# Patient Record
Sex: Female | Born: 1950
Health system: Southern US, Community
[De-identification: ages and names within clinical notes are randomized; demographics above are authoritative.]

## PROBLEM LIST (undated history)

## (undated) ENCOUNTER — Inpatient Hospital Stay: Admission: EM | Payer: Self-pay | Source: Home / Self Care

## (undated) DIAGNOSIS — T7840XA Allergy, unspecified, initial encounter: Secondary | ICD-10-CM

## (undated) DIAGNOSIS — D649 Anemia, unspecified: Secondary | ICD-10-CM

## (undated) DIAGNOSIS — E785 Hyperlipidemia, unspecified: Secondary | ICD-10-CM

## (undated) DIAGNOSIS — I10 Essential (primary) hypertension: Secondary | ICD-10-CM

## (undated) DIAGNOSIS — Z86010 Personal history of colonic polyps: Secondary | ICD-10-CM

## (undated) DIAGNOSIS — M810 Age-related osteoporosis without current pathological fracture: Secondary | ICD-10-CM

## (undated) HISTORY — DX: Allergy, unspecified, initial encounter: T78.40XA

## (undated) HISTORY — DX: Essential (primary) hypertension: I10

## (undated) HISTORY — PX: WISDOM TOOTH EXTRACTION: SHX21

## (undated) HISTORY — DX: Hyperlipidemia, unspecified: E78.5

## (undated) HISTORY — DX: Anemia, unspecified: D64.9

## (undated) HISTORY — DX: Personal history of colonic polyps: Z86.010

## (undated) HISTORY — DX: Age-related osteoporosis without current pathological fracture: M81.0

## (undated) HISTORY — PX: POLYPECTOMY: SHX149

---

## 2003-09-20 ENCOUNTER — Other Ambulatory Visit: Admission: RE | Admit: 2003-09-20 | Discharge: 2003-09-20 | Payer: Self-pay | Admitting: Family Medicine

## 2008-11-11 LAB — HM PAP SMEAR: HM Pap smear: NORMAL

## 2008-11-23 LAB — HM DEXA SCAN

## 2011-05-28 HISTORY — PX: COLONOSCOPY: SHX174

## 2011-06-11 LAB — LIPID PANEL
LDL Cholesterol: 40 mg/dL
Triglycerides: 149 mg/dL (ref 40–160)

## 2011-12-18 ENCOUNTER — Encounter: Payer: Self-pay | Admitting: Internal Medicine

## 2012-01-21 ENCOUNTER — Ambulatory Visit (AMBULATORY_SURGERY_CENTER): Payer: BC Managed Care – PPO | Admitting: *Deleted

## 2012-01-21 ENCOUNTER — Encounter: Payer: Self-pay | Admitting: Internal Medicine

## 2012-01-21 VITALS — Ht 64.5 in | Wt 148.4 lb

## 2012-01-21 DIAGNOSIS — Z1211 Encounter for screening for malignant neoplasm of colon: Secondary | ICD-10-CM

## 2012-01-21 MED ORDER — NA SULFATE-K SULFATE-MG SULF 17.5-3.13-1.6 GM/177ML PO SOLN: 1.0000 | Freq: Once | ORAL | Status: DC

## 2012-01-21 NOTE — Progress Notes (Signed)
No allergies to eggs or soy products 

## 2012-02-04 ENCOUNTER — Ambulatory Visit (AMBULATORY_SURGERY_CENTER): Payer: BC Managed Care – PPO | Admitting: Internal Medicine

## 2012-02-04 ENCOUNTER — Encounter: Payer: Self-pay | Admitting: Internal Medicine

## 2012-02-04 VITALS — BP 129/66 | HR 74 | Temp 97.7°F | Resp 14 | Ht 64.0 in | Wt 148.0 lb

## 2012-02-04 DIAGNOSIS — Z1211 Encounter for screening for malignant neoplasm of colon: Secondary | ICD-10-CM

## 2012-02-04 DIAGNOSIS — D126 Benign neoplasm of colon, unspecified: Secondary | ICD-10-CM

## 2012-02-04 MED ORDER — SODIUM CHLORIDE 0.9 % IV SOLN: 500.0000 mL | INTRAVENOUS | Status: DC

## 2012-02-04 NOTE — Op Note (Signed)
[facility] [address] [place], [address]   COLONOSCOPY PROCEDURE REPORT  PATIENT: Lauren Dunn, Lauren Dunn.  MR#: [id number] BIRTHDATE: 20-Jun-1950 , 61  yrs. old GENDER: Female ENDOSCOPIST: Iva Boop, MD, [facility] REFERRED IO:NGEXBM Christell Constant, M.D. PROCEDURE DATE:  02/04/2012 PROCEDURE:   Colonoscopy with snare polypectomy ASA CLASS:   Class II INDICATIONS:average risk screening. MEDICATIONS: Propofol (Diprivan) 260 mg IV, MAC sedation, administered by CRNA, and These medications were titrated to patient response per physician's verbal order  DESCRIPTION OF PROCEDURE:   After the risks benefits and alternatives of the procedure were thoroughly explained, informed consent was obtained.  A digital rectal exam revealed no abnormalities of the rectum.   The LB [id number]  endoscope was introduced through the anus and advanced to the cecum, which was identified by both the appendix and ileocecal valve. No adverse events experienced.   The quality of the prep was Suprep excellent The instrument was then slowly withdrawn as the colon was fully examined.      COLON FINDINGS: A polypoid shaped pedunculated polyp measuring 9 mm in size was found in the distal sigmoid colon.  A polypectomy was performed using snare cautery.  The resection was complete and the polyp tissue was completely retrieved.   The colon mucosa was otherwise normal.  Retroflexed views revealed no abnormalities. The time to cecum=3 minutes 23 seconds.  Withdrawal time=8 minutes 11 seconds.  The scope was withdrawn and the procedure completed. COMPLICATIONS: There were no complications.  ENDOSCOPIC IMPRESSION: 1.   Pedunculated polyp measuring 9 mm in size was found in the distal sigmoid colon; polypectomy was performed using snare cautery  2.   The colon mucosa was otherwise normal with excellent prep  RECOMMENDATIONS: 1.  Hold aspirin, aspirin products, and anti-inflammatory medication for 1  week. 2.  Timing of repeat colonoscopy will be determined by pathology findings.   eSigned:  Iva Boop, MD, [facility] 02/04/2012 11:13 AM   cc: Rudi Heap, MD and The Patient

## 2012-02-04 NOTE — Progress Notes (Signed)
The pt tolerated the exam well. Maw  \

## 2012-02-04 NOTE — Progress Notes (Signed)
Patient did not experience any of the following events: a burn prior to discharge; a fall within the facility; wrong site/side/patient/procedure/implant event; or a hospital transfer or hospital admission upon discharge from the facility. (G8907) Patient did not have preoperative order for IV antibiotic SSI prophylaxis. (G8918)  

## 2012-02-04 NOTE — Patient Instructions (Addendum)
The colonoscopy showed one polyp - I removed it.   A letter will come to you about the pathology results and timing of next routine colonoscopy.  Thank you for choosing me and [facility].  Iva Boop, MD, FACG  YOU HAD AN ENDOSCOPIC PROCEDURE TODAY AT THE [facility]: Refer to the procedure report that was given to you for any specific questions about what was found during the examination.  If the procedure report does not answer your questions, please call your gastroenterologist to clarify.  If you requested that your care partner not be given the details of your procedure findings, then the procedure report has been included in a sealed envelope for you to review at your convenience later.  YOU SHOULD EXPECT: Some feelings of bloating in the abdomen. Passage of more gas than usual.  Walking can help get rid of the air that was put into your GI tract during the procedure and reduce the bloating. If you had a lower endoscopy (such as a colonoscopy or flexible sigmoidoscopy) you may notice spotting of blood in your stool or on the toilet paper. If you underwent a bowel prep for your procedure, then you may not have a normal bowel movement for a few days.  DIET: Your first meal following the procedure should be a light meal and then it is ok to progress to your normal diet.  A half-sandwich or bowl of soup is an example of a good first meal.  Heavy or fried foods are harder to digest and may make you feel nauseous or bloated.  Likewise meals heavy in dairy and vegetables can cause extra gas to form and this can also increase the bloating.  Drink plenty of fluids but you should avoid alcoholic beverages for 24 hours.  ACTIVITY: Your care partner should take you home directly after the procedure.  You should plan to take it easy, moving slowly for the rest of the day.  You can resume normal activity the day after the procedure however you should NOT DRIVE or use heavy  machinery for 24 hours (because of the sedation medicines used during the test).    SYMPTOMS TO REPORT IMMEDIATELY: A gastroenterologist can be reached at any hour.  During normal business hours, 8:30 AM to 5:00 PM Monday through Friday, call [phone].  After hours and on weekends, please call the GI answering service at [phone] who will take a message and have the physician on call contact you.   Following lower endoscopy (colonoscopy or flexible sigmoidoscopy):  Excessive amounts of blood in the stool  Significant tenderness or worsening of abdominal pains  Swelling of the abdomen that is new, acute  Fever of 100F or higher  Following upper endoscopy (EGD)  Vomiting of blood or coffee ground material  New chest pain or pain under the shoulder blades  Painful or persistently difficult swallowing  New shortness of breath  Fever of 100F or higher  Black, tarry-looking stools  FOLLOW UP: If any biopsies were taken you will be contacted by phone or by letter within the next 1-3 weeks.  Call your gastroenterologist if you have not heard about the biopsies in 3 weeks.  Our staff will call the home number listed on your records the next business day following your procedure to check on you and address any questions or concerns that you may have at that time regarding the information given to you following your procedure. This is a courtesy call and  so if there is no answer at the home number and we have not heard from you through the emergency physician on call, we will assume that you have returned to your regular daily activities without incident.  SIGNATURES/CONFIDENTIALITY: You and/or your care partner have signed paperwork which will be entered into your electronic medical record.  These signatures attest to the fact that that the information above on your After Visit Summary has been reviewed and is understood.  Full responsibility of the confidentiality of this discharge  information lies with you and/or your care-partner.    Polyp information given.    Hold aspirin, aspirin products and anti-inflammatory medication for one week  Next colonoscopy will be scheduled after pathology results are received.

## 2012-02-05 ENCOUNTER — Telehealth: Payer: Self-pay | Admitting: *Deleted

## 2012-02-05 NOTE — Telephone Encounter (Signed)
Left message on number given in admitting yesterday. ewm 

## 2012-02-11 ENCOUNTER — Encounter: Payer: Self-pay | Admitting: Internal Medicine

## 2012-02-11 DIAGNOSIS — Z860100 Personal history of colon polyps, unspecified: Secondary | ICD-10-CM

## 2012-02-11 HISTORY — DX: Personal history of colonic polyps: Z86.010

## 2012-02-11 HISTORY — DX: Personal history of colon polyps, unspecified: Z86.0100

## 2012-02-11 NOTE — Progress Notes (Signed)
Quick Note:  9 mm adenoam - approx 01/2017 repeat colon ______

## 2012-07-03 ENCOUNTER — Encounter: Payer: Self-pay | Admitting: Nurse Practitioner

## 2012-07-03 DIAGNOSIS — E785 Hyperlipidemia, unspecified: Secondary | ICD-10-CM | POA: Insufficient documentation

## 2012-07-03 DIAGNOSIS — I10 Essential (primary) hypertension: Secondary | ICD-10-CM | POA: Insufficient documentation

## 2013-01-05 ENCOUNTER — Other Ambulatory Visit: Payer: Self-pay | Admitting: Nurse Practitioner

## 2013-01-09 ENCOUNTER — Other Ambulatory Visit: Payer: Self-pay | Admitting: Nurse Practitioner

## 2013-01-11 ENCOUNTER — Other Ambulatory Visit: Payer: Self-pay | Admitting: Nurse Practitioner

## 2013-01-11 MED ORDER — LISINOPRIL-HYDROCHLOROTHIAZIDE 20-12.5 MG PO TABS: 1.0000 | ORAL_TABLET | Freq: Every day | ORAL | Status: DC

## 2013-03-11 ENCOUNTER — Other Ambulatory Visit: Payer: Self-pay | Admitting: Nurse Practitioner

## 2013-03-11 MED ORDER — LISINOPRIL-HYDROCHLOROTHIAZIDE 20-12.5 MG PO TABS: 1.0000 | ORAL_TABLET | Freq: Every day | ORAL | Status: DC

## 2013-03-16 ENCOUNTER — Encounter (INDEPENDENT_AMBULATORY_CARE_PROVIDER_SITE_OTHER): Payer: Self-pay

## 2013-03-16 ENCOUNTER — Ambulatory Visit (INDEPENDENT_AMBULATORY_CARE_PROVIDER_SITE_OTHER): Payer: BC Managed Care – PPO | Admitting: Nurse Practitioner

## 2013-03-16 ENCOUNTER — Encounter: Payer: Self-pay | Admitting: Nurse Practitioner

## 2013-03-16 VITALS — BP 157/87 | HR 89 | Temp 98.7°F | Ht 63.5 in | Wt 150.0 lb

## 2013-03-16 DIAGNOSIS — I10 Essential (primary) hypertension: Secondary | ICD-10-CM

## 2013-03-16 DIAGNOSIS — Z124 Encounter for screening for malignant neoplasm of cervix: Secondary | ICD-10-CM

## 2013-03-16 DIAGNOSIS — Z0000 Encounter for general adult medical examination without abnormal findings: Secondary | ICD-10-CM

## 2013-03-16 DIAGNOSIS — Z13820 Encounter for screening for osteoporosis: Secondary | ICD-10-CM

## 2013-03-16 DIAGNOSIS — Z01419 Encounter for gynecological examination (general) (routine) without abnormal findings: Secondary | ICD-10-CM

## 2013-03-16 LAB — POCT URINALYSIS DIPSTICK
Glucose, UA: NEGATIVE
Nitrite, UA: NEGATIVE
Urobilinogen, UA: NEGATIVE
pH, UA: 6

## 2013-03-16 LAB — POCT UA - MICROSCOPIC ONLY
Casts, Ur, LPF, POC: NEGATIVE
Crystals, Ur, HPF, POC: NEGATIVE
Yeast, UA: NEGATIVE

## 2013-03-16 LAB — POCT CBC
Granulocyte percent: 68.1 %G (ref 37–80)
HCT, POC: 38.2 % (ref 37.7–47.9)
Hemoglobin: 12.7 g/dL (ref 12.2–16.2)
MCV: 88.9 fL (ref 80–97)
POC Granulocyte: 4.1 (ref 2–6.9)
RBC: 4.3 M/uL (ref 4.04–5.48)
WBC: 6 10*3/uL (ref 4.6–10.2)

## 2013-03-16 MED ORDER — AZITHROMYCIN 250 MG PO TABS: ORAL_TABLET | ORAL | Status: DC

## 2013-03-16 MED ORDER — LISINOPRIL-HYDROCHLOROTHIAZIDE 20-12.5 MG PO TABS: 1.0000 | ORAL_TABLET | Freq: Every day | ORAL | Status: DC

## 2013-03-16 NOTE — Patient Instructions (Signed)

## 2013-03-16 NOTE — Progress Notes (Signed)
Subjective:    Patient ID: Lauren Dunn, female    DOB: 07-14-1950, 62 y.o.   MRN: [id number]  HPI Patient in for CPE and PAP- SHe is doing well. Complains of cold symptoms- congestion and cough. Patient Active Problem List   Diagnosis Date Noted  . Hypertension 07/03/2012  . Hyperlipidemia LDL goal < 100 07/03/2012  . Personal history of colonic polyps 02/11/2012   Current Outpatient Prescriptions on File Prior to Visit  Medication Sig Dispense Refill  . lisinopril-hydrochlorothiazide (PRINZIDE,ZESTORETIC) 20-12.5 MG per tablet Take 1 tablet by mouth daily.  30 tablet  1   No current facility-administered medications on file prior to visit.   * patient suppose to be on statin but stopped taking because it made her sick.    Review of Systems  Constitutional: Negative.  Negative for fever and chills.  HENT: Positive for rhinorrhea, sinus pressure and sneezing. Negative for ear pain.   Eyes: Negative.   Respiratory: Negative.   Cardiovascular: Negative.   Gastrointestinal: Negative.   Genitourinary: Negative.   Musculoskeletal: Negative.        Objective:   Physical Exam  Constitutional: She is oriented to person, place, and time. She appears well-developed and well-nourished.  HENT:  Head: Normocephalic.  Right Ear: Hearing, tympanic membrane, external ear and ear canal normal.  Left Ear: Hearing, tympanic membrane, external ear and ear canal normal.  Nose: Nose normal.  Mouth/Throat: Uvula is midline and oropharynx is clear and moist.  Eyes: Conjunctivae and EOM are normal. Pupils are equal, round, and reactive to light.  Neck: Normal range of motion and full passive range of motion without pain. Neck supple. No JVD present. Carotid bruit is not present. No mass and no thyromegaly present.  Cardiovascular: Normal rate, normal heart sounds and intact distal pulses.   No murmur heard. Pulmonary/Chest: Effort normal and breath sounds normal.  Abdominal: Soft. Bowel sounds  are normal. She exhibits no mass. There is no tenderness.  Genitourinary: Vagina normal and uterus normal. No breast swelling, tenderness, discharge or bleeding.  bimanual exam-No adnexal masses or tenderness.  Cervix parous and pink no discharge  Musculoskeletal: Normal range of motion.  Lymphadenopathy:    She has no cervical adenopathy.  Neurological: She is alert and oriented to person, place, and time.  Skin: Skin is warm and dry.  Psychiatric: She has a normal mood and affect. Her behavior is normal. Judgment and thought content normal.    BP 157/87  Pulse 89  Temp(Src) 98.7 F (37.1 C) (Oral)  Ht 5' 3.5" (1.613 m)  Wt 150 lb (68.04 kg)  BMI 26.15 kg/m2       Assessment & Plan:   1. Annual physical exam   2. Encounter for routine gynecological examination   3. Hypertension   4. Osteoporosis screening    Orders Placed This Encounter  Procedures  . DG Bone Density    Standing Status: Future     Number of Occurrences:      Standing Expiration Date: 05/16/2014    Order Specific Question:  Reason for Exam (SYMPTOM  OR DIAGNOSIS REQUIRED)    Answer:  screenig    Order Specific Question:  Preferred imaging location?    Answer:  Internal  . CMP14+EGFR  . NMR, lipoprofile  . POCT urinalysis dipstick  . POCT UA - Microscopic Only  . POCT CBC   Meds ordered this encounter  Medications  . lisinopril-hydrochlorothiazide (PRINZIDE,ZESTORETIC) 20-12.5 MG per tablet    Sig: Take 1  tablet by mouth daily.    Dispense:  30 tablet    Refill:  5    Order Specific Question:  Supervising Provider    Answer:  Deborra Medina    Continue all meds Labs pending Diet and exercise encouraged Health maintenance reviewed Follow up in 6 months  Mary-Margaret Daphine Deutscher, FNP

## 2013-03-18 LAB — NMR, LIPOPROFILE
Cholesterol: 192 mg/dL (ref <200)
HDL Cholesterol by NMR: 47 mg/dL (ref >=40)
HDL Particle Number: 34.1 umol/L (ref >=30.5)
LDL Particle Number: 1671 nmol/L — ABNORMAL HIGH (ref <1000)
LDL Size: 20.6 nm (ref >20.5)
LP-IR Score: 50 — ABNORMAL HIGH (ref <=45)
Triglycerides by NMR: 134 mg/dL (ref <150)

## 2013-03-18 LAB — CMP14+EGFR
Albumin/Globulin Ratio: 2 (ref 1.1–2.5)
Albumin: 4.4 g/dL (ref 3.6–4.8)
Alkaline Phosphatase: 76 IU/L (ref 39–117)
BUN/Creatinine Ratio: 13 (ref 11–26)
BUN: 10 mg/dL (ref 8–27)
Creatinine, Ser: 0.77 mg/dL (ref 0.57–1.00)
GFR calc Af Amer: 95 mL/min/{1.73_m2} (ref >59)
GFR calc non Af Amer: 82 mL/min/{1.73_m2} (ref >59)
Globulin, Total: 2.2 g/dL (ref 1.5–4.5)
Glucose: 87 mg/dL (ref 65–99)
Total Bilirubin: 0.4 mg/dL (ref 0.0–1.2)

## 2013-03-18 LAB — PAP IG W/ RFLX HPV ASCU: PAP Smear Comment: 0

## 2013-03-19 ENCOUNTER — Encounter: Payer: Self-pay | Admitting: *Deleted

## 2013-03-19 NOTE — Progress Notes (Signed)
Quick Note:  Copy of labs sent to patient ______ 

## 2013-04-07 ENCOUNTER — Ambulatory Visit (INDEPENDENT_AMBULATORY_CARE_PROVIDER_SITE_OTHER): Payer: BC Managed Care – PPO

## 2013-04-07 ENCOUNTER — Other Ambulatory Visit: Payer: Self-pay | Admitting: Nurse Practitioner

## 2013-04-07 ENCOUNTER — Ambulatory Visit (INDEPENDENT_AMBULATORY_CARE_PROVIDER_SITE_OTHER): Payer: BC Managed Care – PPO | Admitting: Pharmacist

## 2013-04-07 VITALS — Ht 63.5 in | Wt 149.5 lb

## 2013-04-07 DIAGNOSIS — Z13820 Encounter for screening for osteoporosis: Secondary | ICD-10-CM

## 2013-04-07 DIAGNOSIS — M8580 Other specified disorders of bone density and structure, unspecified site: Secondary | ICD-10-CM

## 2013-04-07 DIAGNOSIS — M810 Age-related osteoporosis without current pathological fracture: Secondary | ICD-10-CM | POA: Insufficient documentation

## 2013-04-07 DIAGNOSIS — E785 Hyperlipidemia, unspecified: Secondary | ICD-10-CM

## 2013-04-07 DIAGNOSIS — M899 Disorder of bone, unspecified: Secondary | ICD-10-CM

## 2013-04-07 MED ORDER — RISEDRONATE SODIUM 150 MG PO TABS: 150.0000 mg | ORAL_TABLET | ORAL | Status: DC

## 2013-04-07 MED ORDER — ROSUVASTATIN CALCIUM 10 MG PO TABS: 10.0000 mg | ORAL_TABLET | Freq: Every day | ORAL | Status: DC

## 2013-04-07 NOTE — Progress Notes (Signed)
Patient ID: Lauren Dunn, female   DOB: 12-Aug-1950, 62 y.o.   MRN: [id number]   CC:  Review DEXA results and hyperlipidemia  [facility]oporosis [facility] Current Height: Height: 5' 3.5" (161.3 cm)      Max Lifetime Height:  5\' 4"  Current Weight: Weight: 149 lb 8 oz (67.813 kg)       Ethnicity:Caucasian    HPI: Does pt already have a diagnosis of:  Osteopenia?  Yes Osteoporosis?  No She also had recent lipid test performed and was found to have elevated LDL-P. She has taken atorvastatin in past but discontinued due to myalgias  Back Pain?  Yes - only when she over exerts Kyphosis?  No Prior fracture?  Yes - toe and foot Med(s) for Osteoporosis/Osteopenia:  None currently Med(s) previously tried for Osteoporosis/Osteopenia:  Fosamax - unsure why stopped - took for about 6 months.                                                             PMH: Age at menopause:  Early 95's Hysterectomy?  No Oophorectomy?  No HRT? No Steroid Use?  No Thyroid med?  No History of cancer?  No History of digestive disorders (ie Crohn's)?  No Current or previous eating disorders?  No Last Vitamin D Result:  31 (2012) Last GFR Result: 82 (02/2013)   FH/SH: Family history of osteoporosis?  Yes - mother and sisters Parent with history of hip fracture?  No, but her 3 yo sister just has hip fracture Family history of breast cancer?  No Exercise?  Yes - walking 1-2 miles 3 times per week Smoking?  Yes - has tried Chantix in past with some success Alcohol?  No    Calcium Assessment Calcium Intake  # of servings/day  Calcium mg  Milk (8 oz) 1  x  300  = 300mg   Yogurt (4 oz) 1 x  200 = 200mg   Cheese (1 oz) 0 x  200 = 0  Other Calcium sources   250mg   Ca supplement 0 = 0   Estimated calcium intake per day 750mg     DEXA Results Date of Test T-Score for AP Spine L1-L4 T-Score for Total Left Hip T-Score for Total Right Hip  04/07/2013 -2.4 -1.2 -1.1  11/23/2008 -2.4 -1.1 -1.0       04/07/2013 L2  T-Score was -3.5 L3 T-Score was -2.6    Lipid Panel from 03/16/2013  LDL-P = 1671  LDL-C = 118  HDL = 47  Tg = 134  Assessment: Osteoporosis  Hyperlipidemia with intolerance to atorvastatin  Recommendations: 1.  Start  Actonel 150mg  1 tablet weekly and crestor 10mg  1 tablet daily 2.  recommend calcium 1200mg  daily through supplementation or diet.  3.  recommend weight bearing exercise - 30 minutes at least 4 days  per week.   4.  Counseled and educated about fall risk and prevention.  Recheck DEXA:  2 years Recheck Lipids in 3 months.  Time spent counseling patient:  30 minutes  Henrene Pastor, PharmD, CPP

## 2013-04-07 NOTE — Patient Instructions (Signed)

## 2013-09-02 ENCOUNTER — Encounter: Payer: Self-pay | Admitting: Nurse Practitioner

## 2013-09-02 ENCOUNTER — Ambulatory Visit (INDEPENDENT_AMBULATORY_CARE_PROVIDER_SITE_OTHER): Payer: BC Managed Care – PPO | Admitting: Nurse Practitioner

## 2013-09-02 VITALS — BP 141/79 | HR 96 | Temp 99.4°F | Ht 63.5 in | Wt 160.6 lb

## 2013-09-02 DIAGNOSIS — J101 Influenza due to other identified influenza virus with other respiratory manifestations: Secondary | ICD-10-CM

## 2013-09-02 DIAGNOSIS — J111 Influenza due to unidentified influenza virus with other respiratory manifestations: Secondary | ICD-10-CM

## 2013-09-02 DIAGNOSIS — J029 Acute pharyngitis, unspecified: Secondary | ICD-10-CM

## 2013-09-02 DIAGNOSIS — R52 Pain, unspecified: Secondary | ICD-10-CM

## 2013-09-02 LAB — POCT INFLUENZA A/B
INFLUENZA B, POC: POSITIVE
Influenza A, POC: POSITIVE

## 2013-09-02 LAB — POCT RAPID STREP A (OFFICE): Rapid Strep A Screen: NEGATIVE

## 2013-09-02 MED ORDER — OSELTAMIVIR PHOSPHATE 75 MG PO CAPS: 75.0000 mg | ORAL_CAPSULE | Freq: Two times a day (BID) | ORAL | Status: DC

## 2013-09-02 MED ORDER — HYDROCODONE-HOMATROPINE 5-1.5 MG/5ML PO SYRP: 5.0000 mL | ORAL_SOLUTION | Freq: Three times a day (TID) | ORAL | Status: DC | PRN

## 2013-09-02 MED ORDER — AZITHROMYCIN 250 MG PO TABS: ORAL_TABLET | ORAL | Status: DC

## 2013-09-02 NOTE — Patient Instructions (Signed)

## 2013-09-02 NOTE — Progress Notes (Signed)
Subjective:    Patient ID: Lauren Dunn, female    DOB: Nov 12, 1950, 63 y.o.   MRN: [id number]  HPI Patient in c/o cough and congestion that started over the weekend- sorethroat , low garde fever and achy all over.    Review of Systems  Constitutional: Positive for fever and fatigue.  HENT: Positive for congestion, ear pain, rhinorrhea and sinus pressure.   Respiratory: Positive for cough.   Cardiovascular: Negative.   Gastrointestinal: Negative.   All other systems reviewed and are negative.      Objective:   Physical Exam  Constitutional: She appears well-developed and well-nourished.  HENT:  Right Ear: Hearing, tympanic membrane, external ear and ear canal normal.  Left Ear: Hearing, tympanic membrane, external ear and ear canal normal.  Nose: Mucosal edema and rhinorrhea present. Right sinus exhibits maxillary sinus tenderness. Right sinus exhibits no frontal sinus tenderness. Left sinus exhibits maxillary sinus tenderness. Left sinus exhibits no frontal sinus tenderness.  Mouth/Throat: Uvula is midline, oropharynx is clear and moist and mucous membranes are normal.  Eyes: Pupils are equal, round, and reactive to light.  Neck: Normal range of motion. Neck supple.  Cardiovascular: Normal rate, regular rhythm and normal heart sounds.   Pulmonary/Chest: Effort normal. No respiratory distress. She has no wheezes.  Deep tight cough, rhonchi scattered throughout  Neurological: She is alert.  Skin: Skin is warm and dry.  Psychiatric: She has a normal mood and affect. Her behavior is normal. Judgment and thought content normal.   BP 141/79  Pulse 96  Temp(Src) 99.4 F (37.4 C) (Oral)  Ht 5' 3.5" (1.613 m)  Wt 160 lb 9.6 oz (72.848 kg)  BMI 28.00 kg/m2   Results for orders placed in visit on 09/02/13  POCT RAPID STREP A (OFFICE)      Result Value Ref Range   Rapid Strep A Screen Negative  Negative  POCT INFLUENZA A/B      Result Value Ref Range   Influenza A, POC  Positive     Influenza B, POC Positive          Assessment & Plan:   1. Sore throat   2. Body aches   3. Influenza A   4. Influenza B    Meds ordered this encounter  Medications  . oseltamivir (TAMIFLU) 75 MG capsule    Sig: Take 1 capsule (75 mg total) by mouth 2 (two) times daily.    Dispense:  10 capsule    Refill:  0    Order Specific Question:  Supervising Provider    Answer:  Chipper Herb [[id number]]  . azithromycin (ZITHROMAX Z-PAK) 250 MG tablet    Sig: As directed    Dispense:  6 each    Refill:  0    Order Specific Question:  Supervising Provider    Answer:  Chipper Herb [[id number]]  . HYDROcodone-homatropine (HYCODAN) 5-1.5 MG/5ML syrup    Sig: Take 5 mLs by mouth every 8 (eight) hours as needed for cough.    Dispense:  120 mL    Refill:  0    Order Specific Question:  Supervising Provider    Answer:  Chipper Herb [[id number]]   1. Take meds as prescribed 2. Use a cool mist humidifier especially during the winter months and when heat has been humid. 3. Use saline nose sprays frequently 4. Saline irrigations of the nose can be very helpful if done frequently.  * 4X daily for 1 week*  *  Use of a nettie pot can be helpful with this. Follow directions with this* 5. Drink plenty of fluids 6. Keep thermostat turn down low 7.For any cough or congestion  Use plain Mucinex- regular strength or max strength is fine   * Children- consult with Pharmacist for dosing 8. For fever or aces or pains- take tylenol or ibuprofen appropriate for age and weight.  * for fevers greater than 101 orally you may alternate ibuprofen and tylenol every  3 hours.   [name], FNP

## 2013-09-07 ENCOUNTER — Other Ambulatory Visit: Payer: Self-pay | Admitting: Nurse Practitioner

## 2013-09-07 MED ORDER — CIPROFLOXACIN HCL 500 MG PO TABS: 500.0000 mg | ORAL_TABLET | Freq: Two times a day (BID) | ORAL | Status: DC

## 2013-09-20 ENCOUNTER — Other Ambulatory Visit: Payer: Self-pay | Admitting: Nurse Practitioner

## 2013-09-21 NOTE — Telephone Encounter (Signed)
Last seen 09/02/13  MMM Last lipid 03/16/13

## 2013-09-21 NOTE — Telephone Encounter (Signed)
Patient NTBS for follow up and lab work  

## 2013-10-26 ENCOUNTER — Other Ambulatory Visit: Payer: Self-pay | Admitting: Nurse Practitioner

## 2013-10-27 NOTE — Telephone Encounter (Signed)
Last lipids 10/14. ntbs

## 2013-10-27 NOTE — Telephone Encounter (Signed)
Patient NTBS for follow up and lab work  

## 2013-11-09 ENCOUNTER — Other Ambulatory Visit: Payer: Self-pay | Admitting: Nurse Practitioner

## 2013-12-10 ENCOUNTER — Ambulatory Visit (INDEPENDENT_AMBULATORY_CARE_PROVIDER_SITE_OTHER): Payer: BC Managed Care – PPO | Admitting: Nurse Practitioner

## 2013-12-10 ENCOUNTER — Encounter: Payer: Self-pay | Admitting: Nurse Practitioner

## 2013-12-10 VITALS — BP 140/82 | HR 80 | Temp 98.9°F | Ht 63.5 in | Wt 156.0 lb

## 2013-12-10 DIAGNOSIS — M810 Age-related osteoporosis without current pathological fracture: Secondary | ICD-10-CM

## 2013-12-10 DIAGNOSIS — Z6827 Body mass index (BMI) 27.0-27.9, adult: Secondary | ICD-10-CM

## 2013-12-10 DIAGNOSIS — Z713 Dietary counseling and surveillance: Secondary | ICD-10-CM

## 2013-12-10 DIAGNOSIS — E785 Hyperlipidemia, unspecified: Secondary | ICD-10-CM

## 2013-12-10 DIAGNOSIS — I10 Essential (primary) hypertension: Secondary | ICD-10-CM

## 2013-12-10 MED ORDER — LISINOPRIL-HYDROCHLOROTHIAZIDE 20-12.5 MG PO TABS: ORAL_TABLET | ORAL | Status: DC

## 2013-12-10 MED ORDER — RISEDRONATE SODIUM 150 MG PO TABS: 150.0000 mg | ORAL_TABLET | ORAL | Status: DC

## 2013-12-10 MED ORDER — ROSUVASTATIN CALCIUM 10 MG PO TABS: ORAL_TABLET | ORAL | Status: DC

## 2013-12-10 NOTE — Progress Notes (Signed)
Subjective:    Patient ID: Lauren Dunn, female    DOB: 1951-05-20, 63 y.o.   MRN: [id number]  Patient here today for follow up of chronic medical problems.  Hypertension This is a chronic problem. The current episode started more than 1 year ago. The problem has been waxing and waning since onset. The problem is uncontrolled. Pertinent negatives include no blurred vision, headaches, palpitations, peripheral edema or shortness of breath. Risk factors for coronary artery disease include dyslipidemia, family history, post-menopausal state and smoking/tobacco exposure. Past treatments include ACE inhibitors and diuretics. The current treatment provides moderate improvement. There are no compliance problems.   Hyperlipidemia This is a chronic problem. The current episode started more than 1 year ago. The problem is controlled. Recent lipid tests were reviewed and are normal. She has no history of diabetes, hypothyroidism or obesity. There are no known factors aggravating her hyperlipidemia. Pertinent negatives include no shortness of breath. Current antihyperlipidemic treatment includes statins. The current treatment provides moderate improvement of lipids. There are no compliance problems.  Risk factors for coronary artery disease include dyslipidemia, hypertension and post-menopausal.      Review of Systems  Eyes: Negative for blurred vision.  Respiratory: Negative for shortness of breath.   Cardiovascular: Negative for palpitations.  Neurological: Negative for headaches.  All other systems reviewed and are negative.      Objective:   Physical Exam  Constitutional: She is oriented to person, place, and time. She appears well-developed and well-nourished.  HENT:  Nose: Nose normal.  Mouth/Throat: Oropharynx is clear and moist.  Eyes: EOM are normal.  Neck: Trachea normal, normal range of motion and full passive range of motion without pain. Neck supple. No JVD present. Carotid bruit is  not present. No thyromegaly present.  Cardiovascular: Normal rate, regular rhythm, normal heart sounds and intact distal pulses.  Exam reveals no gallop and no friction rub.   No murmur heard. Pulmonary/Chest: Effort normal and breath sounds normal.  Abdominal: Soft. Bowel sounds are normal. She exhibits no distension and no mass. There is no tenderness.  Musculoskeletal: Normal range of motion.  Lymphadenopathy:    She has no cervical adenopathy.  Neurological: She is alert and oriented to person, place, and time. She has normal reflexes.  Skin: Skin is warm and dry.  Psychiatric: She has a normal mood and affect. Her behavior is normal. Judgment and thought content normal.   BP 140/82  Pulse 80  Temp(Src) 98.9 F (37.2 C) (Oral)  Ht 5' 3.5" (1.613 m)  Wt 156 lb (70.761 kg)  BMI 27.20 kg/m2        Assessment & Plan:   1. Hyperlipidemia with target LDL less than 100   2. Essential hypertension   3. Osteoporosis   4. BMI 27.0-27.9,adult   5. Weight loss counseling, encounter for    Orders Placed This Encounter  Procedures  . CMP14+EGFR  . NMR, lipoprofile   Meds ordered this encounter  Medications  . risedronate (ACTONEL) 150 MG tablet    Sig: Take 1 tablet (150 mg total) by mouth every 30 (thirty) days. with water on empty stomach, nothing by mouth or lie down for next 30 minutes.    Dispense:  3 tablet    Refill:  3    Order Specific Question:  Supervising Provider    Answer:  Chipper Herb [[id number]]  . lisinopril-hydrochlorothiazide (PRINZIDE,ZESTORETIC) 20-12.5 MG per tablet    Sig: TAKE ONE TABLET BY MOUTH ONCE DAILY  Dispense:  30 tablet    Refill:  5    Order Specific Question:  Supervising Provider    Answer:  Chipper Herb [[id number]]  . rosuvastatin (CRESTOR) 10 MG tablet    Sig: TAKE ONE TABLET BY MOUTH ONCE DAILY    Dispense:  30 tablet    Refill:  5    Order Specific Question:  Supervising Provider    Answer:  Joycelyn Man   STOP  SMOKING!! Labs pending Health maintenance reviewed Diet and exercise encouraged Continue all meds Follow up  In 3 months   [place], FNP

## 2013-12-10 NOTE — Patient Instructions (Signed)

## 2013-12-11 LAB — NMR, LIPOPROFILE
Cholesterol: 169 mg/dL (ref 100–199)
HDL Cholesterol by NMR: 50 mg/dL (ref >39)
HDL Particle Number: 39.2 umol/L (ref >=30.5)
LDL PARTICLE NUMBER: 862 nmol/L (ref <1000)
LDL SIZE: 20.8 nm (ref >20.5)
LDLC SERPL CALC-MCNC: 82 mg/dL (ref 0–99)
LP-IR Score: 60 — ABNORMAL HIGH (ref <=45)
Small LDL Particle Number: 400 nmol/L (ref <=527)
TRIGLYCERIDES BY NMR: 186 mg/dL — AB (ref 0–149)

## 2013-12-11 LAB — CMP14+EGFR
ALT: 17 IU/L (ref 0–32)
AST: 22 IU/L (ref 0–40)
Albumin/Globulin Ratio: 2.1 (ref 1.1–2.5)
Albumin: 4.6 g/dL (ref 3.6–4.8)
Alkaline Phosphatase: 66 IU/L (ref 39–117)
BILIRUBIN TOTAL: 0.4 mg/dL (ref 0.0–1.2)
BUN/Creatinine Ratio: 13 (ref 11–26)
BUN: 11 mg/dL (ref 8–27)
CHLORIDE: 97 mmol/L (ref 97–108)
CO2: 25 mmol/L (ref 18–29)
Calcium: 10.1 mg/dL (ref 8.7–10.3)
Creatinine, Ser: 0.87 mg/dL (ref 0.57–1.00)
GFR calc non Af Amer: 71 mL/min/{1.73_m2} (ref >59)
GFR, EST AFRICAN AMERICAN: 81 mL/min/{1.73_m2} (ref >59)
GLUCOSE: 83 mg/dL (ref 65–99)
Globulin, Total: 2.2 g/dL (ref 1.5–4.5)
POTASSIUM: 4.8 mmol/L (ref 3.5–5.2)
Sodium: 137 mmol/L (ref 134–144)
TOTAL PROTEIN: 6.8 g/dL (ref 6.0–8.5)

## 2013-12-13 ENCOUNTER — Telehealth: Payer: Self-pay | Admitting: Family Medicine

## 2013-12-13 NOTE — Telephone Encounter (Signed)
Message copied by Waverly Ferrari on Mon Dec 13, 2013  2:58 PM ------      Message from: Chevis Pretty      Created: Mon Dec 13, 2013  1:22 PM       Kidney and liver function stable      Cholesterol looks good      Continue current meds- low fat diet and exercise and recheck in 3 months       ------

## 2013-12-14 ENCOUNTER — Encounter: Payer: Self-pay | Admitting: Nurse Practitioner

## 2013-12-14 NOTE — Telephone Encounter (Signed)
Patient aware.

## 2014-05-09 ENCOUNTER — Other Ambulatory Visit: Payer: Self-pay | Admitting: Nurse Practitioner

## 2014-05-09 NOTE — Telephone Encounter (Signed)
Given 9 months refills at visit 11/2013

## 2014-06-02 ENCOUNTER — Ambulatory Visit (INDEPENDENT_AMBULATORY_CARE_PROVIDER_SITE_OTHER): Payer: BC Managed Care – PPO | Admitting: Nurse Practitioner

## 2014-06-02 ENCOUNTER — Encounter (INDEPENDENT_AMBULATORY_CARE_PROVIDER_SITE_OTHER): Payer: Self-pay

## 2014-06-02 ENCOUNTER — Encounter: Payer: Self-pay | Admitting: Nurse Practitioner

## 2014-06-02 VITALS — BP 136/91 | HR 87 | Temp 98.5°F | Ht 63.0 in | Wt 163.0 lb

## 2014-06-02 DIAGNOSIS — E785 Hyperlipidemia, unspecified: Secondary | ICD-10-CM

## 2014-06-02 DIAGNOSIS — M810 Age-related osteoporosis without current pathological fracture: Secondary | ICD-10-CM

## 2014-06-02 DIAGNOSIS — I10 Essential (primary) hypertension: Secondary | ICD-10-CM

## 2014-06-02 MED ORDER — ROSUVASTATIN CALCIUM 10 MG PO TABS: ORAL_TABLET | ORAL | Status: DC

## 2014-06-02 MED ORDER — RISEDRONATE SODIUM 150 MG PO TABS: 150.0000 mg | ORAL_TABLET | ORAL | Status: DC

## 2014-06-02 MED ORDER — LISINOPRIL-HYDROCHLOROTHIAZIDE 20-12.5 MG PO TABS: ORAL_TABLET | ORAL | Status: DC

## 2014-06-02 NOTE — Progress Notes (Signed)
Subjective:    Patient ID: Lauren Dunn, female    DOB: 01/13/1951, 64 y.o.   MRN: [id number]  Patient here today for follow up of chronic medical problems. Patient retired in December  Hypertension This is a chronic problem. The current episode started more than 1 year ago. The problem is unchanged. The problem is controlled. Pertinent negatives include no headaches, palpitations or shortness of breath. Risk factors for coronary artery disease include dyslipidemia and post-menopausal state. Past treatments include diuretics and ACE inhibitors. The current treatment provides significant improvement.  Hyperlipidemia This is a chronic problem. The current episode started more than 1 year ago. The problem is controlled. Recent lipid tests were reviewed and are variable. Pertinent negatives include no shortness of breath. Current antihyperlipidemic treatment includes statins. The current treatment provides significant improvement of lipids. Compliance problems include adherence to diet.  Risk factors for coronary artery disease include dyslipidemia, hypertension and post-menopausal.  Osteoporosis Actonel weekly without side effects  * Right elbow pain- started 2 weeks ago  Review of Systems  Respiratory: Negative for shortness of breath.   Cardiovascular: Negative for palpitations.  Neurological: Negative for headaches.  All other systems reviewed and are negative.      Objective:   Physical Exam  Constitutional: She is oriented to person, place, and time. She appears well-developed and well-nourished.  HENT:  Nose: Nose normal.  Mouth/Throat: Oropharynx is clear and moist.  Eyes: EOM are normal.  Neck: Trachea normal, normal range of motion and full passive range of motion without pain. Neck supple. No JVD present. Carotid bruit is not present. No thyromegaly present.  Cardiovascular: Normal rate, regular rhythm, normal heart sounds and intact distal pulses.  Exam reveals no gallop and  no friction rub.   No murmur heard. Pulmonary/Chest: Effort normal and breath sounds normal.  Abdominal: Soft. Bowel sounds are normal. She exhibits no distension and no mass. There is no tenderness.  Musculoskeletal: Normal range of motion.  Lymphadenopathy:    She has no cervical adenopathy.  Neurological: She is alert and oriented to person, place, and time. She has normal reflexes.  Skin: Skin is warm and dry.  Psychiatric: She has a normal mood and affect. Her behavior is normal. Judgment and thought content normal.   BP 136/91 mmHg  Pulse 87  Temp(Src) 98.5 F (36.9 C) (Oral)  Ht 5' 3" (1.6 m)  Wt 163 lb (73.936 kg)  BMI 28.88 kg/m2        Assessment & Plan:   1. Essential hypertension Do not add salt to diet - lisinopril-hydrochlorothiazide (PRINZIDE,ZESTORETIC) 20-12.5 MG per tablet; TAKE ONE TABLET BY MOUTH ONCE DAILY  Dispense: 30 tablet; Refill: 5 - CMP14+EGFR  2. Hyperlipidemia with target LDL less than 100 Low fta diet - rosuvastatin (CRESTOR) 10 MG tablet; TAKE ONE TABLET BY MOUTH ONCE DAILY  Dispense: 30 tablet; Refill: 5 - NMR, lipoprofile  3. Osteoporosis Weight bearing exercises - risedronate (ACTONEL) 150 MG tablet; Take 1 tablet (150 mg total) by mouth every 30 (thirty) days. with water on empty stomach, nothing by mouth or lie down for next 30 minutes.  Dispense: 3 tablet; Refill: 3    Labs pending Health maintenance reviewed Diet and exercise encouraged Continue all meds Follow up  In 3 month   Mary-Margaret Martin, FNP    

## 2014-06-02 NOTE — Patient Instructions (Signed)

## 2014-06-03 LAB — CMP14+EGFR
A/G RATIO: 2.1 (ref 1.1–2.5)
ALT: 17 IU/L (ref 0–32)
AST: 21 IU/L (ref 0–40)
Albumin: 4.4 g/dL (ref 3.6–4.8)
Alkaline Phosphatase: 65 IU/L (ref 39–117)
BILIRUBIN TOTAL: 0.4 mg/dL (ref 0.0–1.2)
BUN / CREAT RATIO: 19 (ref 11–26)
BUN: 17 mg/dL (ref 8–27)
CALCIUM: 9.6 mg/dL (ref 8.7–10.3)
CHLORIDE: 98 mmol/L (ref 97–108)
CO2: 26 mmol/L (ref 18–29)
CREATININE: 0.89 mg/dL (ref 0.57–1.00)
GFR calc Af Amer: 79 mL/min/{1.73_m2} (ref >59)
GFR, EST NON AFRICAN AMERICAN: 69 mL/min/{1.73_m2} (ref >59)
GLOBULIN, TOTAL: 2.1 g/dL (ref 1.5–4.5)
Glucose: 85 mg/dL (ref 65–99)
Potassium: 4.4 mmol/L (ref 3.5–5.2)
Sodium: 139 mmol/L (ref 134–144)
Total Protein: 6.5 g/dL (ref 6.0–8.5)

## 2014-06-03 LAB — NMR, LIPOPROFILE
Cholesterol: 161 mg/dL (ref 100–199)
HDL Cholesterol by NMR: 46 mg/dL (ref >39)
HDL Particle Number: 32.8 umol/L (ref >=30.5)
LDL Particle Number: 735 nmol/L (ref <1000)
LDL SIZE: 20.9 nm (ref >20.5)
LDL-C: 74 mg/dL (ref 0–99)
LP-IR SCORE: 53 — AB (ref <=45)
SMALL LDL PARTICLE NUMBER: 307 nmol/L (ref <=527)
Triglycerides by NMR: 207 mg/dL — ABNORMAL HIGH (ref 0–149)

## 2014-11-10 ENCOUNTER — Encounter: Payer: Self-pay | Admitting: Physician Assistant

## 2014-11-10 ENCOUNTER — Ambulatory Visit (INDEPENDENT_AMBULATORY_CARE_PROVIDER_SITE_OTHER): Payer: Medicare PPO | Admitting: Physician Assistant

## 2014-11-10 VITALS — BP 141/86 | HR 95 | Temp 99.0°F | Ht 63.0 in | Wt 157.2 lb

## 2014-11-10 DIAGNOSIS — M545 Low back pain, unspecified: Secondary | ICD-10-CM

## 2014-11-10 MED ORDER — MELOXICAM 15 MG PO TABS: 15.0000 mg | ORAL_TABLET | Freq: Every day | ORAL | Status: DC

## 2014-11-10 MED ORDER — CYCLOBENZAPRINE HCL 10 MG PO TABS: 10.0000 mg | ORAL_TABLET | Freq: Three times a day (TID) | ORAL | Status: DC | PRN

## 2014-11-10 NOTE — Progress Notes (Signed)
Subjective:     Patient ID: Lauren Dunn, female   DOB: 06-Dec-1950, 64 y.o.   MRN: [id number]  HPI Pt with a several week hx of R LBP She denies any injury Pt was doing regular activities when she felt a pull to the area Using heat and ice as well as Ibuprofen No radiation of sx to hip or leg No change in bowel/bladder function  Review of Systems  Gastrointestinal: Negative.   Genitourinary: Negative.   Musculoskeletal: Positive for back pain.  Neurological: Negative for weakness.       Objective:   Physical Exam Sitting in NAD + TTP over the R SI joint No palp spasm FROM of L-spine- sx with flexion and rotation R SLR neg Muscle strength testing distal good and equal Sensory good DTR 1+/= lower ext    Assessment:     Back pain    Plan:     Heat/Ice Massage Gentle stretching Mobic 15mg  q d x 1week Flexeril 10mg  1 po tid #21- SE reviewed F/U prn

## 2014-11-10 NOTE — Patient Instructions (Addendum)
Back Pain, Adult Low back pain is very common. About 1 in 5 people have back pain.The cause of low back pain is rarely dangerous. The pain often gets better over time.About half of people with a sudden onset of back pain feel better in just 2 weeks. About 8 in 10 people feel better by 6 weeks.  CAUSES Some common causes of back pain include:  Strain of the muscles or ligaments supporting the spine.  Wear and tear (degeneration) of the spinal discs.  Arthritis.  Direct injury to the back. DIAGNOSIS Most of the time, the direct cause of low back pain is not known.However, back pain can be treated effectively even when the exact cause of the pain is unknown.Answering your caregiver's questions about your overall health and symptoms is one of the most accurate ways to make sure the cause of your pain is not dangerous. If your caregiver needs more information, he or she may order lab work or imaging tests (X-rays or MRIs).However, even if imaging tests show changes in your back, this usually does not require surgery. HOME CARE INSTRUCTIONS For many people, back pain returns.Since low back pain is rarely dangerous, it is often a condition that people can learn to manageon their own.   Remain active. It is stressful on the back to sit or stand in one place. Do not sit, drive, or stand in one place for more than 30 minutes at a time. Take short walks on level surfaces as soon as pain allows.Try to increase the length of time you walk each day.  Do not stay in bed.Resting more than 1 or 2 days can delay your recovery.  Do not avoid exercise or work.Your body is made to move.It is not dangerous to be active, even though your back may hurt.Your back will likely heal faster if you return to being active before your pain is gone.  Pay attention to your body when you bend and lift. Many people have less discomfortwhen lifting if they bend their knees, keep the load close to their bodies,and  avoid twisting. Often, the most comfortable positions are those that put less stress on your recovering back.  Find a comfortable position to sleep. Use a firm mattress and lie on your side with your knees slightly bent. If you lie on your back, put a pillow under your knees.  Only take over-the-counter or prescription medicines as directed by your caregiver. Over-the-counter medicines to reduce pain and inflammation are often the most helpful.Your caregiver may prescribe muscle relaxant drugs.These medicines help dull your pain so you can more quickly return to your normal activities and healthy exercise.  Put ice on the injured area.  Put ice in a plastic bag.  Place a towel between your skin and the bag.  Leave the ice on for 15-20 minutes, 03-04 times a day for the first 2 to 3 days. After that, ice and heat may be alternated to reduce pain and spasms.  Ask your caregiver about trying back exercises and gentle massage. This may be of some benefit.  Avoid feeling anxious or stressed.Stress increases muscle tension and can worsen back pain.It is important to recognize when you are anxious or stressed and learn ways to manage it.Exercise is a great option. SEEK MEDICAL CARE IF:  You have pain that is not relieved with rest or medicine.  You have pain that does not improve in 1 week.  You have new symptoms.  You are generally not feeling well. SEEK   IMMEDIATE MEDICAL CARE IF:   You have pain that radiates from your back into your legs.  You develop new bowel or bladder control problems.  You have unusual weakness or numbness in your arms or legs.  You develop nausea or vomiting.  You develop abdominal pain.  You feel faint. Document Released: 05/13/2005 Document Revised: 11/12/2011 Document Reviewed: 09/14/2013 Exit[employer] Patient Information 2015 [employer]. This information is not intended to replace advice given to you by your health care provider. Make sure you  discuss any questions you have with your health care provider.  

## 2014-11-28 ENCOUNTER — Other Ambulatory Visit: Payer: Self-pay | Admitting: Nurse Practitioner

## 2015-01-23 ENCOUNTER — Ambulatory Visit (INDEPENDENT_AMBULATORY_CARE_PROVIDER_SITE_OTHER): Payer: Medicare PPO

## 2015-01-23 ENCOUNTER — Ambulatory Visit (INDEPENDENT_AMBULATORY_CARE_PROVIDER_SITE_OTHER): Payer: Medicare PPO | Admitting: Nurse Practitioner

## 2015-01-23 ENCOUNTER — Encounter: Payer: Self-pay | Admitting: Nurse Practitioner

## 2015-01-23 VITALS — BP 118/74 | HR 85 | Temp 98.9°F | Ht 63.0 in | Wt 157.0 lb

## 2015-01-23 DIAGNOSIS — Z720 Tobacco use: Secondary | ICD-10-CM

## 2015-01-23 DIAGNOSIS — F17200 Nicotine dependence, unspecified, uncomplicated: Secondary | ICD-10-CM

## 2015-01-23 DIAGNOSIS — Z01419 Encounter for gynecological examination (general) (routine) without abnormal findings: Secondary | ICD-10-CM

## 2015-01-23 DIAGNOSIS — N3000 Acute cystitis without hematuria: Secondary | ICD-10-CM | POA: Diagnosis not present

## 2015-01-23 DIAGNOSIS — I10 Essential (primary) hypertension: Secondary | ICD-10-CM

## 2015-01-23 DIAGNOSIS — E785 Hyperlipidemia, unspecified: Secondary | ICD-10-CM

## 2015-01-23 DIAGNOSIS — Z0000 Encounter for general adult medical examination without abnormal findings: Secondary | ICD-10-CM | POA: Diagnosis not present

## 2015-01-23 DIAGNOSIS — M810 Age-related osteoporosis without current pathological fracture: Secondary | ICD-10-CM

## 2015-01-23 DIAGNOSIS — Z23 Encounter for immunization: Secondary | ICD-10-CM | POA: Diagnosis not present

## 2015-01-23 DIAGNOSIS — R319 Hematuria, unspecified: Secondary | ICD-10-CM | POA: Diagnosis not present

## 2015-01-23 LAB — POCT URINALYSIS DIPSTICK
Bilirubin, UA: NEGATIVE
GLUCOSE UA: NEGATIVE
Ketones, UA: NEGATIVE
NITRITE UA: NEGATIVE
PH UA: 6
SPEC GRAV UA: 1.015
Urobilinogen, UA: NEGATIVE

## 2015-01-23 LAB — POCT UA - MICROSCOPIC ONLY
CASTS, UR, LPF, POC: NEGATIVE
CRYSTALS, UR, HPF, POC: NEGATIVE
Mucus, UA: NEGATIVE
YEAST UA: NEGATIVE

## 2015-01-23 MED ORDER — ROSUVASTATIN CALCIUM 10 MG PO TABS: ORAL_TABLET | ORAL | Status: DC

## 2015-01-23 MED ORDER — LISINOPRIL-HYDROCHLOROTHIAZIDE 20-12.5 MG PO TABS: 1.0000 | ORAL_TABLET | Freq: Every day | ORAL | Status: DC

## 2015-01-23 MED ORDER — NITROFURANTOIN MONOHYD MACRO 100 MG PO CAPS: 100.0000 mg | ORAL_CAPSULE | Freq: Two times a day (BID) | ORAL | Status: DC

## 2015-01-23 MED ORDER — RISEDRONATE SODIUM 150 MG PO TABS: 150.0000 mg | ORAL_TABLET | ORAL | Status: DC

## 2015-01-23 NOTE — Progress Notes (Signed)
Subjective:    Patient ID: Lauren Dunn, female    DOB: [birth year], 64 y.o.   MRN: [id number]  Patient here today for follow up of CPE and PAP as well as chronic medical problems. SHe is doing well without complaints.   Hypertension This is a chronic problem. The current episode started more than 1 year ago. The problem is unchanged. The problem is controlled. Pertinent negatives include no headaches, palpitations or shortness of breath. Risk factors for coronary artery disease include dyslipidemia and post-menopausal state. Past treatments include diuretics and ACE inhibitors. The current treatment provides significant improvement.  Hyperlipidemia This is a chronic problem. The current episode started more than 1 year ago. The problem is controlled. Recent lipid tests were reviewed and are variable. Pertinent negatives include no shortness of breath. Current antihyperlipidemic treatment includes statins. The current treatment provides significant improvement of lipids. Compliance problems include adherence to diet.  Risk factors for coronary artery disease include dyslipidemia, hypertension and post-menopausal.  Osteoporosis Actonel weekly without side effects    Review of Systems  Constitutional: Negative.   HENT: Negative.   Respiratory: Negative for shortness of breath.   Cardiovascular: Negative for palpitations.  Gastrointestinal: Negative.   Neurological: Negative.  Negative for headaches.  Psychiatric/Behavioral: Negative.   All other systems reviewed and are negative.      Objective:   Physical Exam  Constitutional: She is oriented to person, place, and time. She appears well-developed and well-nourished.  HENT:  Nose: Nose normal.  Mouth/Throat: Oropharynx is clear and moist.  Eyes: EOM are normal.  Neck: Trachea normal, normal range of motion and full passive range of motion without pain. Neck supple. No JVD present. Carotid bruit is not present. No thyromegaly  present.  Cardiovascular: Normal rate, regular rhythm, normal heart sounds and intact distal pulses.  Exam reveals no gallop and no friction rub.   No murmur heard. Pulmonary/Chest: Effort normal and breath sounds normal.  Abdominal: Soft. Bowel sounds are normal. She exhibits no distension and no mass. There is no tenderness.  Musculoskeletal: Normal range of motion.  Lymphadenopathy:    She has no cervical adenopathy.  Neurological: She is alert and oriented to person, place, and time. She has normal reflexes.  Skin: Skin is warm and dry.  Psychiatric: She has a normal mood and affect. Her behavior is normal. Judgment and thought content normal.   BP 118/74 mmHg  Pulse 85  Temp(Src) 98.9 F (37.2 C) (Oral)  Ht '5\' 3"'  (1.6 m)  Wt 157 lb (71.215 kg)  BMI 27.82 kg/m2  Chest x ray- chronic bronchitic changes-Preliminary reading by Ronnald Collum, FNP  [facility]   EKG- NSR-[name], FNP   Results for orders placed or performed in visit on 01/23/15  POCT UA - Microscopic Only  Result Value Ref Range   WBC, Ur, HPF, POC 10-15    RBC, urine, microscopic occ    Bacteria, U Microscopic occ    Mucus, UA neg    Epithelial cells, urine per micros few    Crystals, Ur, HPF, POC neg    Casts, Ur, LPF, POC neg    Yeast, UA neg   POCT urinalysis dipstick  Result Value Ref Range   Color, UA gold    Clarity, UA clear    Glucose, UA neg    Bilirubin, UA neg    Ketones, UA neg    Spec Grav, UA 1.015    Blood, UA moderate    pH, UA 6.0  Protein, UA 1+    Urobilinogen, UA negative    Nitrite, UA neg    Leukocytes, UA moderate (2+) (A) Negative          Assessment & Plan:   1. Annual physical exam - POCT UA - Microscopic Only - POCT urinalysis dipstick - CBC with Differential/Platelet - Thyroid Panel With TSH  2. Encounter for routine gynecological examination - Pap IG (Image Guided)  3. Essential hypertension Do not add slat to diet -  lisinopril-hydrochlorothiazide (PRINZIDE,ZESTORETIC) 20-12.5 MG per tablet; Take 1 tablet by mouth daily.  Dispense: 30 tablet; Refill: 5 - CMP14+EGFR - EKG 12-Lead  4. Osteoporosis Weight bearing exercises dexa scan done today - risedronate (ACTONEL) 150 MG tablet; Take 1 tablet (150 mg total) by mouth every 30 (thirty) days. with water on empty stomach, nothing by mouth or lie down for next 30 minutes.  Dispense: 3 tablet; Refill: 3  5. Hyperlipidemia with target LDL less than 100 Low fat diet - rosuvastatin (CRESTOR) 10 MG tablet; TAKE ONE TABLET BY MOUTH ONCE DAILY  Dispense: 30 tablet; Refill: 5 - Lipid panel  6. Smoker Smoking cessation encouraged - DG Chest 2 View; Future  7. Acute cystitis without hematuria Take medication as prescribe Cotton underwear Take shower not bath Cranberry juice, yogurt Force fluids AZO over the counter X2 days Culture pending RTO prn - Urine culture - nitrofurantoin, macrocrystal-monohydrate, (MACROBID) 100 MG capsule; Take 1 capsule (100 mg total) by mouth 2 (two) times daily. 1 po BId  Dispense: 14 capsule; Refill: 0    Labs pending Health maintenance reviewed Diet and exercise encouraged Continue all meds Follow up  In 3 months   [place], FNP

## 2015-01-24 DIAGNOSIS — I10 Essential (primary) hypertension: Secondary | ICD-10-CM | POA: Diagnosis not present

## 2015-01-24 DIAGNOSIS — M810 Age-related osteoporosis without current pathological fracture: Secondary | ICD-10-CM | POA: Diagnosis not present

## 2015-01-24 DIAGNOSIS — N3000 Acute cystitis without hematuria: Secondary | ICD-10-CM | POA: Diagnosis not present

## 2015-01-24 DIAGNOSIS — Z23 Encounter for immunization: Secondary | ICD-10-CM

## 2015-01-24 DIAGNOSIS — Z0000 Encounter for general adult medical examination without abnormal findings: Secondary | ICD-10-CM | POA: Diagnosis not present

## 2015-01-24 DIAGNOSIS — Z01419 Encounter for gynecological examination (general) (routine) without abnormal findings: Secondary | ICD-10-CM | POA: Diagnosis not present

## 2015-01-24 DIAGNOSIS — E785 Hyperlipidemia, unspecified: Secondary | ICD-10-CM | POA: Diagnosis not present

## 2015-01-24 DIAGNOSIS — Z720 Tobacco use: Secondary | ICD-10-CM | POA: Diagnosis not present

## 2015-01-24 LAB — CBC WITH DIFFERENTIAL/PLATELET
BASOS ABS: 0.1 10*3/uL (ref 0.0–0.2)
BASOS: 1 %
EOS (ABSOLUTE): 0.1 10*3/uL (ref 0.0–0.4)
Eos: 2 %
Hematocrit: 38.8 % (ref 34.0–46.6)
Hemoglobin: 12.8 g/dL (ref 11.1–15.9)
IMMATURE GRANULOCYTES: 0 %
Immature Grans (Abs): 0 10*3/uL (ref 0.0–0.1)
Lymphocytes Absolute: 2.1 10*3/uL (ref 0.7–3.1)
Lymphs: 32 %
MCH: 29.4 pg (ref 26.6–33.0)
MCHC: 33 g/dL (ref 31.5–35.7)
MCV: 89 fL (ref 79–97)
MONOS ABS: 0.5 10*3/uL (ref 0.1–0.9)
Monocytes: 8 %
NEUTROS PCT: 57 %
Neutrophils Absolute: 3.7 10*3/uL (ref 1.4–7.0)
PLATELETS: 284 10*3/uL (ref 150–379)
RBC: 4.35 x10E6/uL (ref 3.77–5.28)
RDW: 14.7 % (ref 12.3–15.4)
WBC: 6.5 10*3/uL (ref 3.4–10.8)

## 2015-01-24 LAB — LIPID PANEL
CHOL/HDL RATIO: 3.8 ratio (ref 0.0–4.4)
Cholesterol, Total: 171 mg/dL (ref 100–199)
HDL: 45 mg/dL (ref >39)
LDL Calculated: 82 mg/dL (ref 0–99)
Triglycerides: 218 mg/dL — ABNORMAL HIGH (ref 0–149)
VLDL Cholesterol Cal: 44 mg/dL — ABNORMAL HIGH (ref 5–40)

## 2015-01-24 LAB — CMP14+EGFR
A/G RATIO: 1.8 (ref 1.1–2.5)
ALT: 17 IU/L (ref 0–32)
AST: 22 IU/L (ref 0–40)
Albumin: 4.4 g/dL (ref 3.6–4.8)
Alkaline Phosphatase: 63 IU/L (ref 39–117)
BUN / CREAT RATIO: 19 (ref 11–26)
BUN: 16 mg/dL (ref 8–27)
Bilirubin Total: 0.4 mg/dL (ref 0.0–1.2)
CALCIUM: 9.6 mg/dL (ref 8.7–10.3)
CO2: 26 mmol/L (ref 18–29)
Chloride: 99 mmol/L (ref 97–108)
Creatinine, Ser: 0.83 mg/dL (ref 0.57–1.00)
GFR, EST AFRICAN AMERICAN: 86 mL/min/{1.73_m2} (ref >59)
GFR, EST NON AFRICAN AMERICAN: 74 mL/min/{1.73_m2} (ref >59)
GLOBULIN, TOTAL: 2.4 g/dL (ref 1.5–4.5)
Glucose: 83 mg/dL (ref 65–99)
Potassium: 4.1 mmol/L (ref 3.5–5.2)
SODIUM: 140 mmol/L (ref 134–144)
TOTAL PROTEIN: 6.8 g/dL (ref 6.0–8.5)

## 2015-01-24 LAB — THYROID PANEL WITH TSH
Free Thyroxine Index: 2 (ref 1.2–4.9)
T3 UPTAKE RATIO: 24 % (ref 24–39)
T4 TOTAL: 8.4 ug/dL (ref 4.5–12.0)
TSH: 1.48 u[IU]/mL (ref 0.450–4.500)

## 2015-01-24 NOTE — Addendum Note (Signed)
Addended by: Rolena Infante on: 01/24/2015 08:56 AM   Modules accepted: Orders

## 2015-01-25 LAB — PAP IG (IMAGE GUIDED): PAP Smear Comment: 0

## 2015-01-25 LAB — URINE CULTURE

## 2015-07-26 ENCOUNTER — Encounter: Payer: Self-pay | Admitting: Nurse Practitioner

## 2015-07-26 ENCOUNTER — Ambulatory Visit (INDEPENDENT_AMBULATORY_CARE_PROVIDER_SITE_OTHER): Payer: Medicare Other | Admitting: Nurse Practitioner

## 2015-07-26 VITALS — BP 130/84 | HR 77 | Temp 98.0°F | Ht 63.0 in | Wt 161.0 lb

## 2015-07-26 DIAGNOSIS — Z6828 Body mass index (BMI) 28.0-28.9, adult: Secondary | ICD-10-CM | POA: Insufficient documentation

## 2015-07-26 DIAGNOSIS — I10 Essential (primary) hypertension: Secondary | ICD-10-CM | POA: Diagnosis not present

## 2015-07-26 DIAGNOSIS — M810 Age-related osteoporosis without current pathological fracture: Secondary | ICD-10-CM

## 2015-07-26 DIAGNOSIS — Z6827 Body mass index (BMI) 27.0-27.9, adult: Secondary | ICD-10-CM | POA: Diagnosis not present

## 2015-07-26 DIAGNOSIS — Z1159 Encounter for screening for other viral diseases: Secondary | ICD-10-CM

## 2015-07-26 DIAGNOSIS — E785 Hyperlipidemia, unspecified: Secondary | ICD-10-CM | POA: Diagnosis not present

## 2015-07-26 MED ORDER — RISEDRONATE SODIUM 150 MG PO TABS
150.0000 mg | ORAL_TABLET | ORAL | Status: DC
Start: 2015-07-26 — End: 2016-01-25

## 2015-07-26 MED ORDER — ROSUVASTATIN CALCIUM 10 MG PO TABS: ORAL_TABLET | ORAL | Status: DC

## 2015-07-26 MED ORDER — LISINOPRIL-HYDROCHLOROTHIAZIDE 20-12.5 MG PO TABS: 1.0000 | ORAL_TABLET | Freq: Every day | ORAL | Status: DC

## 2015-07-26 NOTE — Patient Instructions (Signed)
Smoking Cessation, Tips for Success If you are ready to quit smoking, congratulations! You have chosen to help yourself be healthier. Cigarettes bring nicotine, tar, carbon monoxide, and other irritants into your body. Your lungs, heart, and blood vessels will be able to work better without these poisons. There are many different ways to quit smoking. Nicotine gum, nicotine patches, a nicotine inhaler, or nicotine nasal spray can help with physical craving. Hypnosis, support groups, and medicines help break the habit of smoking. WHAT THINGS CAN I DO TO MAKE QUITTING EASIER?  Here are some tips to help you quit for good:  Pick a date when you will quit smoking completely. Tell all of your friends and family about your plan to quit on that date.  Do not try to slowly cut down on the number of cigarettes you are smoking. Pick a quit date and quit smoking completely starting on that day.  Throw away all cigarettes.   Clean and remove all ashtrays from your home, work, and car.  On a card, write down your reasons for quitting. Carry the card with you and read it when you get the urge to smoke.  Cleanse your body of nicotine. Drink enough water and fluids to keep your urine clear or pale yellow. Do this after quitting to flush the nicotine from your body.  Learn to predict your moods. Do not let a bad situation be your excuse to have a cigarette. Some situations in your life might tempt you into wanting a cigarette.  Never have "just one" cigarette. It leads to wanting another and another. Remind yourself of your decision to quit.  Change habits associated with smoking. If you smoked while driving or when feeling stressed, try other activities to replace smoking. Stand up when drinking your coffee. Brush your teeth after eating. Sit in a different chair when you read the paper. Avoid alcohol while trying to quit, and try to drink fewer caffeinated beverages. Alcohol and caffeine may urge you to  smoke.  Avoid foods and drinks that can trigger a desire to smoke, such as sugary or spicy foods and alcohol.  Ask people who smoke not to smoke around you.  Have something planned to do right after eating or having a cup of coffee. For example, plan to take a walk or exercise.  Try a relaxation exercise to calm you down and decrease your stress. Remember, you may be tense and nervous for the first 2 weeks after you quit, but this will pass.  Find new activities to keep your hands busy. Play with a pen, coin, or rubber band. Doodle or draw things on paper.  Brush your teeth right after eating. This will help cut down on the craving for the taste of tobacco after meals. You can also try mouthwash.   Use oral substitutes in place of cigarettes. Try using lemon drops, carrots, cinnamon sticks, or chewing gum. Keep them handy so they are available when you have the urge to smoke.  When you have the urge to smoke, try deep breathing.  Designate your home as a nonsmoking area.  If you are a heavy smoker, ask your health care provider about a prescription for nicotine chewing gum. It can ease your withdrawal from nicotine.  Reward yourself. Set aside the cigarette money you save and buy yourself something nice.  Look for support from others. Join a support group or smoking cessation program. Ask someone at home or at work to help you with your plan   to quit smoking.  Always ask yourself, "Do I need this cigarette or is this just a reflex?" Tell yourself, "Today, I choose not to smoke," or "I do not want to smoke." You are reminding yourself of your decision to quit.  Do not replace cigarette smoking with electronic cigarettes (commonly called e-cigarettes). The safety of e-cigarettes is unknown, and some may contain harmful chemicals.  If you relapse, do not give up! Plan ahead and think about what you will do the next time you get the urge to smoke. HOW WILL I FEEL WHEN I QUIT SMOKING? You  may have symptoms of withdrawal because your body is used to nicotine (the addictive substance in cigarettes). You may crave cigarettes, be irritable, feel very hungry, cough often, get headaches, or have difficulty concentrating. The withdrawal symptoms are only temporary. They are strongest when you first quit but will go away within 10-14 days. When withdrawal symptoms occur, stay in control. Think about your reasons for quitting. Remind yourself that these are signs that your body is healing and getting used to being without cigarettes. Remember that withdrawal symptoms are easier to treat than the major diseases that smoking can cause.  Even after the withdrawal is over, expect periodic urges to smoke. However, these cravings are generally short lived and will go away whether you smoke or not. Do not smoke! WHAT RESOURCES ARE AVAILABLE TO HELP ME QUIT SMOKING? Your health care provider can direct you to community resources or hospitals for support, which may include:  Group support.  Education.  Hypnosis.  Therapy.   This information is not intended to replace advice given to you by your health care provider. Make sure you discuss any questions you have with your health care provider.   Document Released: 02/09/2004 Document Revised: 06/03/2014 Document Reviewed: 10/29/2012 Elsevier Interactive Patient Education 2016 [employer]  

## 2015-07-26 NOTE — Progress Notes (Signed)
Subjective:    Patient ID: Lauren Dunn, female    DOB: Aug 09, 1950, 65 y.o.   MRN: [id number]  Patient here today for follow up of chronic medical problems.  Outpatient Encounter Prescriptions as of 07/26/2015  Medication Sig  . cyclobenzaprine (FLEXERIL) 10 MG tablet Take 1 tablet (10 mg total) by mouth 3 (three) times daily as needed for muscle spasms.  Marland Kitchen lisinopril-hydrochlorothiazide (PRINZIDE,ZESTORETIC) 20-12.5 MG per tablet Take 1 tablet by mouth daily.  . meloxicam (MOBIC) 15 MG tablet Take 1 tablet (15 mg total) by mouth daily.  . nitrofurantoin, macrocrystal-monohydrate, (MACROBID) 100 MG capsule Take 1 capsule (100 mg total) by mouth 2 (two) times daily. 1 po BId  . risedronate (ACTONEL) 150 MG tablet Take 1 tablet (150 mg total) by mouth every 30 (thirty) days. with water on empty stomach, nothing by mouth or lie down for next 30 minutes.  . rosuvastatin (CRESTOR) 10 MG tablet TAKE ONE TABLET BY MOUTH ONCE DAILY   No facility-administered encounter medications on file as of 07/26/2015.     Hypertension This is a chronic problem. The current episode started more than 1 year ago. The problem is unchanged. The problem is controlled. Pertinent negatives include no headaches, palpitations or shortness of breath. Risk factors for coronary artery disease include dyslipidemia and post-menopausal state. Past treatments include diuretics and ACE inhibitors. The current treatment provides significant improvement.  Hyperlipidemia This is a chronic problem. The current episode started more than 1 year ago. The problem is controlled. Recent lipid tests were reviewed and are variable. Pertinent negatives include no shortness of breath. Current antihyperlipidemic treatment includes statins. The current treatment provides significant improvement of lipids. Compliance problems include adherence to diet.  Risk factors for coronary artery disease include dyslipidemia, hypertension and post-menopausal.    Osteoporosis Actonel weekly without side effects    Review of Systems  Constitutional: Negative.   HENT: Negative.   Respiratory: Negative for shortness of breath.   Cardiovascular: Negative for palpitations.  Gastrointestinal: Negative.   Neurological: Negative.  Negative for headaches.  Psychiatric/Behavioral: Negative.   All other systems reviewed and are negative.      Objective:   Physical Exam  Constitutional: She is oriented to person, place, and time. She appears well-developed and well-nourished.  HENT:  Nose: Nose normal.  Mouth/Throat: Oropharynx is clear and moist.  Eyes: EOM are normal.  Neck: Trachea normal, normal range of motion and full passive range of motion without pain. Neck supple. No JVD present. Carotid bruit is not present. No thyromegaly present.  Cardiovascular: Normal rate, regular rhythm, normal heart sounds and intact distal pulses.  Exam reveals no gallop and no friction rub.   No murmur heard. Pulmonary/Chest: Effort normal and breath sounds normal.  Abdominal: Soft. Bowel sounds are normal. She exhibits no distension and no mass. There is no tenderness.  Musculoskeletal: Normal range of motion.  Lymphadenopathy:    She has no cervical adenopathy.  Neurological: She is alert and oriented to person, place, and time. She has normal reflexes.  Skin: Skin is warm and dry.  Psychiatric: She has a normal mood and affect. Her behavior is normal. Judgment and thought content normal.    BP 130/84 mmHg  Pulse 77  Temp(Src) 98 F (36.7 C) (Oral)  Ht '5\' 3"'  (1.6 m)  Wt 161 lb (73.029 kg)  BMI 28.53 kg/m2     Assessment & Plan:  1. Essential hypertension Do not add salt to diet - lisinopril-hydrochlorothiazide (PRINZIDE,ZESTORETIC) 20-12.5 MG  tablet; Take 1 tablet by mouth daily.  Dispense: 30 tablet; Refill: 5 - CMP14+EGFR  2. Osteoporosis Weight bearing exercises - risedronate (ACTONEL) 150 MG tablet; Take 1 tablet (150 mg total) by mouth  every 30 (thirty) days. with water on empty stomach, nothing by mouth or lie down for next 30 minutes.  Dispense: 3 tablet; Refill: 3  3. Hyperlipidemia with target LDL less than 100 Low fat diet - rosuvastatin (CRESTOR) 10 MG tablet; TAKE ONE TABLET BY MOUTH ONCE DAILY  Dispense: 30 tablet; Refill: 5 - Lipid panel  4. BMI 27.0-27.9,adult Discussed diet and exercise for person with BMI >25 Will recheck weight in 3-6 months  5. Need for hepatitis C screening test - Hepatitis C antibody    Labs pending Health maintenance reviewed Diet and exercise encouraged Continue all meds Follow up  In 6 month   [place], FNP

## 2015-07-27 LAB — HEPATITIS C ANTIBODY: Hep C Virus Ab: <0.1 s/co ratio (ref 0.0–0.9)

## 2015-07-27 LAB — CMP14+EGFR
A/G RATIO: 1.8 (ref 1.1–2.5)
ALK PHOS: 60 IU/L (ref 39–117)
ALT: 31 IU/L (ref 0–32)
AST: 30 IU/L (ref 0–40)
Albumin: 4.3 g/dL (ref 3.6–4.8)
BUN/Creatinine Ratio: 17 (ref 11–26)
BUN: 13 mg/dL (ref 8–27)
Bilirubin Total: 0.3 mg/dL (ref 0.0–1.2)
CHLORIDE: 97 mmol/L (ref 96–106)
CO2: 23 mmol/L (ref 18–29)
Calcium: 9.5 mg/dL (ref 8.7–10.3)
Creatinine, Ser: 0.76 mg/dL (ref 0.57–1.00)
GFR calc Af Amer: 95 mL/min/{1.73_m2} (ref >59)
GFR calc non Af Amer: 83 mL/min/{1.73_m2} (ref >59)
GLOBULIN, TOTAL: 2.4 g/dL (ref 1.5–4.5)
Glucose: 82 mg/dL (ref 65–99)
POTASSIUM: 3.9 mmol/L (ref 3.5–5.2)
SODIUM: 139 mmol/L (ref 134–144)
Total Protein: 6.7 g/dL (ref 6.0–8.5)

## 2015-07-27 LAB — LIPID PANEL
CHOLESTEROL TOTAL: 134 mg/dL (ref 100–199)
Chol/HDL Ratio: 3.4 ratio units (ref 0.0–4.4)
HDL: 40 mg/dL (ref >39)
LDL Calculated: 57 mg/dL (ref 0–99)
TRIGLYCERIDES: 186 mg/dL — AB (ref 0–149)
VLDL Cholesterol Cal: 37 mg/dL (ref 5–40)

## 2015-10-24 ENCOUNTER — Encounter: Payer: Medicare Other | Admitting: *Deleted

## 2016-01-25 ENCOUNTER — Encounter: Payer: Self-pay | Admitting: Nurse Practitioner

## 2016-01-25 ENCOUNTER — Ambulatory Visit (INDEPENDENT_AMBULATORY_CARE_PROVIDER_SITE_OTHER): Payer: Medicare Other | Admitting: Nurse Practitioner

## 2016-01-25 VITALS — BP 129/79 | HR 80 | Temp 97.8°F | Ht 63.0 in | Wt 162.0 lb

## 2016-01-25 DIAGNOSIS — Z6828 Body mass index (BMI) 28.0-28.9, adult: Secondary | ICD-10-CM

## 2016-01-25 DIAGNOSIS — Z23 Encounter for immunization: Secondary | ICD-10-CM

## 2016-01-25 DIAGNOSIS — Z0000 Encounter for general adult medical examination without abnormal findings: Secondary | ICD-10-CM | POA: Diagnosis not present

## 2016-01-25 DIAGNOSIS — F17200 Nicotine dependence, unspecified, uncomplicated: Secondary | ICD-10-CM

## 2016-01-25 DIAGNOSIS — M810 Age-related osteoporosis without current pathological fracture: Secondary | ICD-10-CM

## 2016-01-25 DIAGNOSIS — M546 Pain in thoracic spine: Secondary | ICD-10-CM

## 2016-01-25 DIAGNOSIS — Z1211 Encounter for screening for malignant neoplasm of colon: Secondary | ICD-10-CM

## 2016-01-25 DIAGNOSIS — Z1212 Encounter for screening for malignant neoplasm of rectum: Secondary | ICD-10-CM

## 2016-01-25 DIAGNOSIS — I10 Essential (primary) hypertension: Secondary | ICD-10-CM

## 2016-01-25 DIAGNOSIS — E785 Hyperlipidemia, unspecified: Secondary | ICD-10-CM

## 2016-01-25 LAB — URINALYSIS, COMPLETE
BILIRUBIN UA: NEGATIVE
Glucose, UA: NEGATIVE
Ketones, UA: NEGATIVE
LEUKOCYTES UA: NEGATIVE
Nitrite, UA: NEGATIVE
PH UA: 7 (ref 5.0–7.5)
PROTEIN UA: NEGATIVE
RBC UA: NEGATIVE
Specific Gravity, UA: 1.015 (ref 1.005–1.030)
Urobilinogen, Ur: 1 mg/dL (ref 0.2–1.0)

## 2016-01-25 LAB — MICROSCOPIC EXAMINATION

## 2016-01-25 MED ORDER — ROSUVASTATIN CALCIUM 10 MG PO TABS: ORAL_TABLET | ORAL | 5 refills | Status: DC

## 2016-01-25 MED ORDER — RISEDRONATE SODIUM 150 MG PO TABS: 150.0000 mg | ORAL_TABLET | ORAL | 3 refills | Status: DC

## 2016-01-25 MED ORDER — LISINOPRIL-HYDROCHLOROTHIAZIDE 20-12.5 MG PO TABS: 1.0000 | ORAL_TABLET | Freq: Every day | ORAL | 5 refills | Status: DC

## 2016-01-25 MED ORDER — CYCLOBENZAPRINE HCL 5 MG PO TABS: 5.0000 mg | ORAL_TABLET | Freq: Three times a day (TID) | ORAL | 1 refills | Status: DC | PRN

## 2016-01-25 MED ORDER — VARENICLINE TARTRATE 0.5 MG X 11 & 1 MG X 42 PO MISC: ORAL | 0 refills | Status: DC

## 2016-01-25 MED ORDER — VARENICLINE TARTRATE 1 MG PO TABS: 1.0000 mg | ORAL_TABLET | Freq: Two times a day (BID) | ORAL | 2 refills | Status: DC

## 2016-01-25 NOTE — Progress Notes (Addendum)
Subjective:    Patient ID: Lauren Dunn, female    DOB: [birth year], 65 y.o.   MRN: [id number]  Patient here today for annual physical exam, no PAP and follow up of chronic medical problems. No changes since last visit.   Outpatient Encounter Prescriptions as of 01/25/2016  Medication Sig  . lisinopril-hydrochlorothiazide (PRINZIDE,ZESTORETIC) 20-12.5 MG tablet Take 1 tablet by mouth daily.  . risedronate (ACTONEL) 150 MG tablet Take 1 tablet (150 mg total) by mouth every 30 (thirty) days. with water on empty stomach, nothing by mouth or lie down for next 30 minutes.  . rosuvastatin (CRESTOR) 10 MG tablet TAKE ONE TABLET BY MOUTH ONCE DAILY   No facility-administered encounter medications on file as of 01/25/2016.    * mid back pain- was picking up a clay pot outside on Tuesday and felt a pull in back- rates pain 2/10- increases with movement.  Hypertension  This is a chronic problem. The current episode started more than 1 year ago. The problem is unchanged. The problem is controlled. Pertinent negatives include no headaches, palpitations or shortness of breath. Risk factors for coronary artery disease include dyslipidemia and post-menopausal state. Past treatments include diuretics and ACE inhibitors. The current treatment provides significant improvement.  Hyperlipidemia  This is a chronic problem. The current episode started more than 1 year ago. The problem is controlled. Recent lipid tests were reviewed and are variable. Pertinent negatives include no shortness of breath. Current antihyperlipidemic treatment includes statins. The current treatment provides significant improvement of lipids. Compliance problems include adherence to diet.  Risk factors for coronary artery disease include dyslipidemia, hypertension and post-menopausal.  Osteoporosis Actonel weekly without side effects    Review of Systems  Constitutional: Negative.   HENT: Negative.   Respiratory: Negative for shortness  of breath.   Cardiovascular: Negative for palpitations.  Gastrointestinal: Negative.   Musculoskeletal: Positive for back pain.  Neurological: Negative.  Negative for headaches.  Psychiatric/Behavioral: Negative.   All other systems reviewed and are negative.      Objective:   Physical Exam  Constitutional: She is oriented to person, place, and time. She appears well-developed and well-nourished.  HENT:  Nose: Nose normal.  Mouth/Throat: Oropharynx is clear and moist.  Eyes: EOM are normal.  Neck: Trachea normal, normal range of motion and full passive range of motion without pain. Neck supple. No JVD present. Carotid bruit is not present. No thyromegaly present.  Cardiovascular: Normal rate, regular rhythm, normal heart sounds and intact distal pulses.  Exam reveals no gallop and no friction rub.   No murmur heard. Pulmonary/Chest: Effort normal and breath sounds normal.  Abdominal: Soft. Bowel sounds are normal. She exhibits no distension and no mass. There is no tenderness.  Musculoskeletal: Normal range of motion.  Lymphadenopathy:    She has no cervical adenopathy.  Neurological: She is alert and oriented to person, place, and time. She has normal reflexes.  Skin: Skin is warm and dry.  Psychiatric: She has a normal mood and affect. Her behavior is normal. Judgment and thought content normal.    BP 129/79   Pulse 80   Temp 97.8 F (36.6 C) (Oral)   Ht '5\' 3"'  (1.6 m)   Wt 162 lb (73.5 kg)   BMI 28.70 kg/m      Assessment & Plan:  1. Annual physical exam - Urinalysis, Complete - CBC with Differential/Platelet - Thyroid Panel With TSH - VITAMIN D 25 Hydroxy (Vit-D Deficiency, Fractures)  2. Essential hypertension Do  not add salt to diet - CMP14+EGFR - lisinopril-hydrochlorothiazide (PRINZIDE,ZESTORETIC) 20-12.5 MG tablet; Take 1 tablet by mouth daily.  Dispense: 30 tablet; Refill: 5  3. Osteoporosis Weight bearing exercises - risedronate (ACTONEL) 150 MG tablet;  Take 1 tablet (150 mg total) by mouth every 30 (thirty) days. with water on empty stomach, nothing by mouth or lie down for next 30 minutes.  Dispense: 3 tablet; Refill: 3  4. BMI 28.0-28.9,adult Discussed diet and exercise for person with BMI >25 Will recheck weight in 3-6 months  5. Hyperlipidemia with target LDL less than 100 Low fat diet - Lipid panel - rosuvastatin (CRESTOR) 10 MG tablet; TAKE ONE TABLET BY MOUTH ONCE DAILY  Dispense: 30 tablet; Refill: 5  6. Screening for colorectal cancer - Fecal occult blood, imunochemical; Future  7. Midline thoracic back pain Moist heat to back - cyclobenzaprine (FLEXERIL) 5 MG tablet; Take 1 tablet (5 mg total) by mouth 3 (three) times daily as needed for muscle spasms.  Dispense: 30 tablet; Refill: 1  8. Smoker Smoking cessation encouraged - varenicline (CHANTIX STARTING MONTH PAK) 0.5 MG X 11 & 1 MG X 42 tablet; Take one 0.5 mg tablet by mouth once daily for 3 days, then increase to one 0.5 mg tablet twice daily for 4 days, then increase to one 1 mg tablet twice daily.  Dispense: 53 tablet; Refill: 0 - varenicline (CHANTIX CONTINUING MONTH PAK) 1 MG tablet; Take 1 tablet (1 mg total) by mouth 2 (two) times daily.  Dispense: 60 tablet; Refill: 2    Labs pending Health maintenance reviewed Diet and exercise encouraged Continue all meds Follow up  In 6 month   Rico, FNP

## 2016-01-25 NOTE — Addendum Note (Signed)
Addended by: Rolena Infante on: 01/25/2016 12:31 PM   Modules accepted: Orders

## 2016-01-25 NOTE — Patient Instructions (Addendum)
Smoking Cessation, Tips for Success If you are ready to quit smoking, congratulations! You have chosen to help yourself be healthier. Cigarettes bring nicotine, tar, carbon monoxide, and other irritants into your body. Your lungs, heart, and blood vessels will be able to work better without these poisons. There are many different ways to quit smoking. Nicotine gum, nicotine patches, a nicotine inhaler, or nicotine nasal spray can help with physical craving. Hypnosis, support groups, and medicines help break the habit of smoking. WHAT THINGS CAN I DO TO MAKE QUITTING EASIER?  Here are some tips to help you quit for good:  Pick a date when you will quit smoking completely. Tell all of your friends and family about your plan to quit on that date.  Do not try to slowly cut down on the number of cigarettes you are smoking. Pick a quit date and quit smoking completely starting on that day.  Throw away all cigarettes.   Clean and remove all ashtrays from your home, work, and car.  On a card, write down your reasons for quitting. Carry the card with you and read it when you get the urge to smoke.  Cleanse your body of nicotine. Drink enough water and fluids to keep your urine clear or pale yellow. Do this after quitting to flush the nicotine from your body.  Learn to predict your moods. Do not let a bad situation be your excuse to have a cigarette. Some situations in your life might tempt you into wanting a cigarette.  Never have "just one" cigarette. It leads to wanting another and another. Remind yourself of your decision to quit.  Change habits associated with smoking. If you smoked while driving or when feeling stressed, try other activities to replace smoking. Stand up when drinking your coffee. Brush your teeth after eating. Sit in a different chair when you read the paper. Avoid alcohol while trying to quit, and try to drink fewer caffeinated beverages. Alcohol and caffeine may urge you to  smoke.  Avoid foods and drinks that can trigger a desire to smoke, such as sugary or spicy foods and alcohol.  Ask people who smoke not to smoke around you.  Have something planned to do right after eating or having a cup of coffee. For example, plan to take a walk or exercise.  Try a relaxation exercise to calm you down and decrease your stress. Remember, you may be tense and nervous for the first 2 weeks after you quit, but this will pass.  Find new activities to keep your hands busy. Play with a pen, coin, or rubber band. Doodle or draw things on paper.  Brush your teeth right after eating. This will help cut down on the craving for the taste of tobacco after meals. You can also try mouthwash.   Use oral substitutes in place of cigarettes. Try using lemon drops, carrots, cinnamon sticks, or chewing gum. Keep them handy so they are available when you have the urge to smoke.  When you have the urge to smoke, try deep breathing.  Designate your home as a nonsmoking area.  If you are a heavy smoker, ask your health care provider about a prescription for nicotine chewing gum. It can ease your withdrawal from nicotine.  Reward yourself. Set aside the cigarette money you save and buy yourself something nice.  Look for support from others. Join a support group or smoking cessation program. Ask someone at home or at work to help you with your plan   to quit smoking.  Always ask yourself, "Do I need this cigarette or is this just a reflex?" Tell yourself, "Today, I choose not to smoke," or "I do not want to smoke." You are reminding yourself of your decision to quit.  Do not replace cigarette smoking with electronic cigarettes (commonly called e-cigarettes). The safety of e-cigarettes is unknown, and some may contain harmful chemicals.  If you relapse, do not give up! Plan ahead and think about what you will do the next time you get the urge to smoke. HOW WILL I FEEL WHEN I QUIT SMOKING? You  may have symptoms of withdrawal because your body is used to nicotine (the addictive substance in cigarettes). You may crave cigarettes, be irritable, feel very hungry, cough often, get headaches, or have difficulty concentrating. The withdrawal symptoms are only temporary. They are strongest when you first quit but will go away within 10-14 days. When withdrawal symptoms occur, stay in control. Think about your reasons for quitting. Remind yourself that these are signs that your body is healing and getting used to being without cigarettes. Remember that withdrawal symptoms are easier to treat than the major diseases that smoking can cause.  Even after the withdrawal is over, expect periodic urges to smoke. However, these cravings are generally short lived and will go away whether you smoke or not. Do not smoke! WHAT RESOURCES ARE AVAILABLE TO HELP ME QUIT SMOKING? Your health care provider can direct you to community resources or hospitals for support, which may include:  Group support.  Education.  Hypnosis.  Therapy.   This information is not intended to replace advice given to you by your health care provider. Make sure you discuss any questions you have with your health care provider.   Document Released: 02/09/2004 Document Revised: 06/03/2014 Document Reviewed: 10/29/2012 Elsevier Interactive Patient Education 2016 Elsevier Inc.  

## 2016-01-26 LAB — CMP14+EGFR
A/G RATIO: 1.8 (ref 1.2–2.2)
ALBUMIN: 4.5 g/dL (ref 3.6–4.8)
ALT: 26 IU/L (ref 0–32)
AST: 28 IU/L (ref 0–40)
Alkaline Phosphatase: 60 IU/L (ref 39–117)
BILIRUBIN TOTAL: 0.5 mg/dL (ref 0.0–1.2)
BUN / CREAT RATIO: 19 (ref 12–28)
BUN: 17 mg/dL (ref 8–27)
CALCIUM: 9.8 mg/dL (ref 8.7–10.3)
CHLORIDE: 95 mmol/L — AB (ref 96–106)
CO2: 25 mmol/L (ref 18–29)
Creatinine, Ser: 0.9 mg/dL (ref 0.57–1.00)
GFR, EST AFRICAN AMERICAN: 77 mL/min/{1.73_m2} (ref >59)
GFR, EST NON AFRICAN AMERICAN: 67 mL/min/{1.73_m2} (ref >59)
GLOBULIN, TOTAL: 2.5 g/dL (ref 1.5–4.5)
Glucose: 80 mg/dL (ref 65–99)
POTASSIUM: 4.3 mmol/L (ref 3.5–5.2)
SODIUM: 137 mmol/L (ref 134–144)
TOTAL PROTEIN: 7 g/dL (ref 6.0–8.5)

## 2016-01-26 LAB — THYROID PANEL WITH TSH
Free Thyroxine Index: 2.3 (ref 1.2–4.9)
T3 UPTAKE RATIO: 26 % (ref 24–39)
T4 TOTAL: 8.7 ug/dL (ref 4.5–12.0)
TSH: 0.92 u[IU]/mL (ref 0.450–4.500)

## 2016-01-26 LAB — CBC WITH DIFFERENTIAL/PLATELET
BASOS ABS: 0.1 10*3/uL (ref 0.0–0.2)
BASOS: 1 %
EOS (ABSOLUTE): 0.2 10*3/uL (ref 0.0–0.4)
Eos: 2 %
Hematocrit: 38.3 % (ref 34.0–46.6)
Hemoglobin: 12.8 g/dL (ref 11.1–15.9)
IMMATURE GRANS (ABS): 0 10*3/uL (ref 0.0–0.1)
Immature Granulocytes: 0 %
LYMPHS ABS: 2.1 10*3/uL (ref 0.7–3.1)
LYMPHS: 31 %
MCH: 29.3 pg (ref 26.6–33.0)
MCHC: 33.4 g/dL (ref 31.5–35.7)
MCV: 88 fL (ref 79–97)
Monocytes Absolute: 0.6 10*3/uL (ref 0.1–0.9)
Monocytes: 9 %
NEUTROS ABS: 3.9 10*3/uL (ref 1.4–7.0)
Neutrophils: 57 %
PLATELETS: 273 10*3/uL (ref 150–379)
RBC: 4.37 x10E6/uL (ref 3.77–5.28)
RDW: 14.5 % (ref 12.3–15.4)
WBC: 6.8 10*3/uL (ref 3.4–10.8)

## 2016-01-26 LAB — VITAMIN D 25 HYDROXY (VIT D DEFICIENCY, FRACTURES): Vit D, 25-Hydroxy: 34.9 ng/mL (ref 30.0–100.0)

## 2016-01-26 LAB — LIPID PANEL
Chol/HDL Ratio: 3.5 ratio units (ref 0.0–4.4)
Cholesterol, Total: 176 mg/dL (ref 100–199)
HDL: 50 mg/dL (ref >39)
LDL CALC: 86 mg/dL (ref 0–99)
Triglycerides: 200 mg/dL — ABNORMAL HIGH (ref 0–149)
VLDL CHOLESTEROL CAL: 40 mg/dL (ref 5–40)

## 2016-07-29 ENCOUNTER — Other Ambulatory Visit: Payer: Self-pay | Admitting: Nurse Practitioner

## 2016-07-29 DIAGNOSIS — E785 Hyperlipidemia, unspecified: Secondary | ICD-10-CM

## 2016-07-29 DIAGNOSIS — I10 Essential (primary) hypertension: Secondary | ICD-10-CM

## 2016-11-03 ENCOUNTER — Encounter (HOSPITAL_COMMUNITY): Payer: Self-pay | Admitting: *Deleted

## 2016-11-03 ENCOUNTER — Emergency Department (HOSPITAL_COMMUNITY)
Admission: EM | Admit: 2016-11-03 | Discharge: 2016-11-03 | Disposition: A | Discharge status: Home / Self Care | Payer: Medicare Other | Attending: Emergency Medicine | Admitting: Emergency Medicine

## 2016-11-03 DIAGNOSIS — Y999 Unspecified external cause status: Secondary | ICD-10-CM | POA: Diagnosis not present

## 2016-11-03 DIAGNOSIS — F17200 Nicotine dependence, unspecified, uncomplicated: Secondary | ICD-10-CM | POA: Insufficient documentation

## 2016-11-03 DIAGNOSIS — Y929 Unspecified place or not applicable: Secondary | ICD-10-CM | POA: Insufficient documentation

## 2016-11-03 DIAGNOSIS — Z23 Encounter for immunization: Secondary | ICD-10-CM | POA: Diagnosis not present

## 2016-11-03 DIAGNOSIS — I10 Essential (primary) hypertension: Secondary | ICD-10-CM | POA: Diagnosis not present

## 2016-11-03 DIAGNOSIS — S61211A Laceration without foreign body of left index finger without damage to nail, initial encounter: Secondary | ICD-10-CM

## 2016-11-03 DIAGNOSIS — Y939 Activity, unspecified: Secondary | ICD-10-CM | POA: Diagnosis not present

## 2016-11-03 DIAGNOSIS — W2209XA Striking against other stationary object, initial encounter: Secondary | ICD-10-CM | POA: Diagnosis not present

## 2016-11-03 MED ORDER — POVIDONE-IODINE 10 % EX SOLN
CUTANEOUS | Status: AC
  Filled 2016-11-03: qty 118

## 2016-11-03 MED ORDER — TETANUS-DIPHTH-ACELL PERTUSSIS 5-2.5-18.5 LF-MCG/0.5 IM SUSP
0.5000 mL | Freq: Once | INTRAMUSCULAR | Status: DC
  Filled 2016-11-03: qty 0.5

## 2016-11-03 MED ORDER — LIDOCAINE HCL (PF) 2 % IJ SOLN
10.0000 mL | Freq: Once | INTRAMUSCULAR | Status: AC
  Administered 2016-11-03: 4 mL
  Filled 2016-11-03: qty 10

## 2016-11-03 NOTE — ED Provider Notes (Signed)
[facility] DEPT Provider Note   CSN: [id number] Arrival date & time: 11/03/16  1750     History   Chief Complaint Chief Complaint  Patient presents with  . Laceration    HPI Lauren Dunn is a 66 y.o. female.  Patient is a 66 year old female who presents to the emergency department with complaint of laceration to the left index finger.  The patient states that she was walking her dog when the dog pulled her through a door jam. She sustained a laceration to the left index finger. She thinks that she had her last tetanus shot at her physical on last year. She denies being on any anticoagulation medications. She has no history of bleeding disorders. She's not had any operations or procedures involving the left upper extremity. She's been able to control the bleeding by applying pressure and elevating it.      Past Medical History:  Diagnosis Date  . Allergy   . Hyperlipidemia   . Hypertension   . Personal history of colonic polyps 02/11/2012    Patient Active Problem List   Diagnosis Date Noted  . BMI 28.0-28.9,adult 07/26/2015  . Osteoporosis 04/07/2013  . Hypertension 07/03/2012  . Hyperlipidemia with target LDL less than 100 07/03/2012  . Personal history of colonic polyps 02/11/2012    History reviewed. No pertinent surgical history.  OB History    No data available       Home Medications    Prior to Admission medications   Medication Sig Start Date End Date Taking? Authorizing Provider  cyclobenzaprine (FLEXERIL) 5 MG tablet Take 1 tablet (5 mg total) by mouth 3 (three) times daily as needed for muscle spasms. 01/25/16   Chevis Pretty, FNP  lisinopril-hydrochlorothiazide (PRINZIDE,ZESTORETIC) 20-12.5 MG tablet TAKE ONE TABLET BY MOUTH ONCE DAILY 07/29/16   Chevis Pretty, FNP  risedronate (ACTONEL) 150 MG tablet Take 1 tablet (150 mg total) by mouth every 30 (thirty) days. with water on empty stomach, nothing by mouth or lie down for next 30  minutes. 01/25/16   Hassell Done Mary-Margaret, FNP  rosuvastatin (CRESTOR) 10 MG tablet TAKE ONE TABLET BY MOUTH ONCE DAILY 07/29/16   Chevis Pretty, FNP  varenicline (CHANTIX CONTINUING MONTH PAK) 1 MG tablet Take 1 tablet (1 mg total) by mouth 2 (two) times daily. 01/25/16   Hassell Done Mary-Margaret, FNP  varenicline (CHANTIX STARTING MONTH PAK) 0.5 MG X 11 & 1 MG X 42 tablet Take one 0.5 mg tablet by mouth once daily for 3 days, then increase to one 0.5 mg tablet twice daily for 4 days, then increase to one 1 mg tablet twice daily. 01/25/16   Chevis Pretty, FNP    Family History Family History  Problem Relation Age of Onset  . Diabetes Sister   . Diabetes Brother   . Colon cancer Neg Hx   . Esophageal cancer Neg Hx   . Stomach cancer Neg Hx   . Rectal cancer Neg Hx     Social History Social History  Substance Use Topics  . Smoking status: Current Every Day Smoker    Packs/day: 0.50  . Smokeless tobacco: Never Used  . Alcohol use No     Allergies   Lipitor [atorvastatin] and Sulfur   Review of Systems Review of Systems  Constitutional: Negative for activity change.       All ROS Neg except as noted in HPI  HENT: Negative for nosebleeds.   Eyes: Negative for photophobia and discharge.  Respiratory: Negative for cough, shortness of  breath and wheezing.   Cardiovascular: Negative for chest pain and palpitations.  Gastrointestinal: Negative for abdominal pain and blood in stool.  Genitourinary: Negative for dysuria, frequency and hematuria.  Musculoskeletal: Negative for arthralgias, back pain and neck pain.  Skin: Negative.   Neurological: Negative for dizziness, seizures and speech difficulty.  Psychiatric/Behavioral: Negative for confusion and hallucinations.     Physical Exam Updated Vital Signs BP (!) 157/88 (BP Location: Right Arm)   Pulse 99   Temp 98.9 F (37.2 C) (Oral)   Resp 18   Ht 5' 3.5" (1.613 m)   Wt 72.1 kg (159 lb)   SpO2 100%   BMI 27.72  kg/m   Physical Exam  Constitutional: She is oriented to person, place, and time. She appears well-developed and well-nourished.  Non-toxic appearance.  HENT:  Head: Normocephalic.  Right Ear: Tympanic membrane and external ear normal.  Left Ear: Tympanic membrane and external ear normal.  Eyes: EOM and lids are normal. Pupils are equal, round, and reactive to light.  Neck: Normal range of motion. Neck supple. Carotid bruit is not present.  Cardiovascular: Normal rate, regular rhythm, normal heart sounds, intact distal pulses and normal pulses.   Pulmonary/Chest: Breath sounds normal. No respiratory distress.  Abdominal: Soft. Bowel sounds are normal. There is no tenderness. There is no guarding.  Musculoskeletal: Normal range of motion.       Left hand: She exhibits laceration. She exhibits normal range of motion. Normal sensation noted. Normal strength noted.       Hands: There is full range of motion of the wrist and fingers of the left upper extremity. There is no visualized tendon involvement. Capillary refill is less than 2 seconds.  Lymphadenopathy:       Head (right side): No submandibular adenopathy present.       Head (left side): No submandibular adenopathy present.    She has no cervical adenopathy.  Neurological: She is alert and oriented to person, place, and time. She has normal strength. No cranial nerve deficit or sensory deficit.  No motor or sensory deficits appreciated of the upper right or left extremity.  Skin: Skin is warm and dry.  Psychiatric: She has a normal mood and affect. Her speech is normal.  Nursing note and vitals reviewed.    ED Treatments / Results  Labs (all labs ordered are listed, but only abnormal results are displayed) Labs Reviewed - No data to display  EKG  EKG Interpretation None       Radiology No results found.  Procedures .Marland KitchenLaceration Repair Date/Time: 11/03/2016 7:35 PM Performed by: Lily Kocher Authorized by: Lily Kocher   Consent:    Consent obtained:  Verbal   Consent given by:  Patient   Risks discussed:  Infection, pain, poor cosmetic result and poor wound healing Anesthesia (see MAR for exact dosages):    Anesthesia method:  Local infiltration   Local anesthetic:  Lidocaine 2% w/o epi Laceration details:    Location:  Finger   Finger location:  L index finger   Length (cm):  3.6 Repair type:    Repair type:  Simple Pre-procedure details:    Preparation:  Patient was prepped and draped in usual sterile fashion Exploration:    Hemostasis achieved with:  Direct pressure   Wound exploration: wound explored through full range of motion     Wound extent: no nerve damage noted and no tendon damage noted   Treatment:    Area cleansed with:  Betadine  Skin repair:    Repair method:  Sutures   Suture size:  4-0   Suture material:  Nylon   Number of sutures:  13 Approximation:    Approximation:  Close Post-procedure details:    Dressing:  Non-adherent dressing   Patient tolerance of procedure:  Tolerated well, no immediate complications   (including critical care time)  Medications Ordered in ED Medications  Tdap (BOOSTRIX) injection 0.5 mL (not administered)  povidone-iodine (BETADINE) 10 % external solution (not administered)     Initial Impression / Assessment and Plan / ED Course  I have reviewed the triage vital signs and the nursing notes.  Pertinent labs & imaging results that were available during my care of the patient were reviewed by me and considered in my medical decision making (see chart for details).       Final Clinical Impressions(s) / ED Diagnoses MDM  vital signs wnl. Pt sustained a laceration to the left index finger. Laceration repaired. Discussed keeping wound clean and dry. Pt will have sutures removed in 7 to 10 day. Pt will return to the ED if any signs of infection.   Final diagnoses:  Laceration of left index finger without foreign body without  damage to nail, initial encounter    New Prescriptions New Prescriptions   No medications on file     Annette Stable 11/05/16 Nathanial Millman, MD 11/11/16 1016

## 2016-11-03 NOTE — Discharge Instructions (Signed)
Please cleanse her wound daily. Please change the dressing daily. Have your sutures removed in 7 or 8 days. Please see Dr. Hassell Done, or return to the emergency department if any signs of advancing infection. Please keep your hand elevated as much as possible. May use Tylenol every 4 hours, or ibuprofen every 6 hours for soreness or discomfort.

## 2016-11-03 NOTE — ED Triage Notes (Signed)
Pt has laceration to left index finger from hitting it on the door jam today when her dog jerked. Bleeding is controlled. Unknown last tetanus.

## 2016-11-11 ENCOUNTER — Ambulatory Visit (INDEPENDENT_AMBULATORY_CARE_PROVIDER_SITE_OTHER): Payer: Medicare Other | Admitting: Family

## 2016-11-11 ENCOUNTER — Encounter: Payer: Self-pay | Admitting: Family

## 2016-11-11 VITALS — BP 123/76 | HR 78 | Temp 98.4°F | Ht 63.0 in | Wt 160.0 lb

## 2016-11-11 DIAGNOSIS — M810 Age-related osteoporosis without current pathological fracture: Secondary | ICD-10-CM

## 2016-11-11 DIAGNOSIS — Z4802 Encounter for removal of sutures: Secondary | ICD-10-CM | POA: Diagnosis not present

## 2016-11-11 MED ORDER — RISEDRONATE SODIUM 150 MG PO TABS: 150.0000 mg | ORAL_TABLET | ORAL | 3 refills | Status: DC

## 2016-11-11 NOTE — Patient Instructions (Signed)
Suture Removal, Care After Refer to this sheet in the next few weeks. These instructions provide you with information on caring for yourself after your procedure. Your health care provider may also give you more specific instructions. Your treatment has been planned according to current medical practices, but problems sometimes occur. Call your health care provider if you have any problems or questions after your procedure. What can I expect after the procedure? After your stitches (sutures) are removed, it is typical to have the following:  Some discomfort and swelling in the wound area.  Slight redness in the area.  Follow these instructions at home:  If you have skin adhesive strips over the wound area, do not take the strips off. They will fall off on their own in a few days. If the strips remain in place after 14 days, you may remove them.  Change any bandages (dressings) at least once a day or as directed by your health care provider. If the bandage sticks, soak it off with warm, soapy water.  Apply cream or ointment only as directed by your health care provider. If using cream or ointment, wash the area with soap and water 2 times a day to remove all the cream or ointment. Rinse off the soap and pat the area dry with a clean towel.  Keep the wound area dry and clean. If the bandage becomes wet or dirty, or if it develops a bad smell, change it as soon as possible.  Continue to protect the wound from injury.  Use sunscreen when out in the sun. New scars become sunburned easily. Contact a health care provider if:  You have increasing redness, swelling, or pain in the wound.  You see pus coming from the wound.  You have a fever.  You notice a bad smell coming from the wound or dressing.  Your wound breaks open (edges not staying together). This information is not intended to replace advice given to you by your health care provider. Make sure you discuss any questions you have  with your health care provider. Document Released: 02/05/2001 Document Revised: 10/19/2015 Document Reviewed: 12/23/2012 Elsevier Interactive Patient Education  2017 Elsevier Inc.  

## 2016-11-11 NOTE — Progress Notes (Signed)
   Subjective:    Patient ID: Lauren Dunn, female    DOB: 1950/12/28, 66 y.o.   MRN: [id number]  HPI Pt presents to the office today for 13 suture removal of left index finger. PT went to the ED on 11/03/16 for laceration after hitting the door jam. PT states the area is doing well and denies any fever, discharge, erythemas, or tenderness.   Pt requesting refill on actonel. Last Dexa Scan 12/2014   Review of Systems  Skin: Positive for wound.  All other systems reviewed and are negative.      Objective:   Physical Exam  Constitutional: She appears well-developed and well-nourished.  Cardiovascular: Normal rate, regular rhythm, normal heart sounds and intact distal pulses.   Pulmonary/Chest: Effort normal and breath sounds normal.  Skin: Skin is warm and dry.  Laceration on left index finger, 13 sutures in place    Area cleaned and 13 sutures removed. Pt tolerated well with no s/s of infection   BP 123/76   Pulse 78   Temp 98.4 F (36.9 C) (Oral)   Ht 5\' 3"  (1.6 m)   Wt 160 lb (72.6 kg)   BMI 28.34 kg/m      Assessment & Plan:  1. Osteoporosis, unspecified osteoporosis type, unspecified pathological fracture presence Continue medication  Dexa scan in 12/2016 Weight bearing exercises  - risedronate (ACTONEL) 150 MG tablet; Take 1 tablet (150 mg total) by mouth every 30 (thirty) days. with water on empty stomach, nothing by mouth or lie down for next 30 minutes.  Dispense: 3 tablet; Refill: 3  2. Visit for suture removal Keep clean and dry Report any s/s of infection  Evelina Dun, FNP

## 2017-01-29 ENCOUNTER — Other Ambulatory Visit: Payer: Self-pay | Admitting: Nurse Practitioner

## 2017-01-29 DIAGNOSIS — I10 Essential (primary) hypertension: Secondary | ICD-10-CM

## 2017-01-29 DIAGNOSIS — E785 Hyperlipidemia, unspecified: Secondary | ICD-10-CM

## 2017-02-06 ENCOUNTER — Ambulatory Visit (INDEPENDENT_AMBULATORY_CARE_PROVIDER_SITE_OTHER): Payer: Medicare Other | Admitting: Nurse Practitioner

## 2017-02-06 ENCOUNTER — Encounter: Payer: Self-pay | Admitting: Nurse Practitioner

## 2017-02-06 ENCOUNTER — Ambulatory Visit (INDEPENDENT_AMBULATORY_CARE_PROVIDER_SITE_OTHER): Payer: Medicare Other

## 2017-02-06 VITALS — BP 133/84 | HR 74 | Temp 98.4°F | Ht 63.0 in | Wt 162.0 lb

## 2017-02-06 DIAGNOSIS — Z6828 Body mass index (BMI) 28.0-28.9, adult: Secondary | ICD-10-CM | POA: Diagnosis not present

## 2017-02-06 DIAGNOSIS — F17200 Nicotine dependence, unspecified, uncomplicated: Secondary | ICD-10-CM

## 2017-02-06 DIAGNOSIS — I10 Essential (primary) hypertension: Secondary | ICD-10-CM

## 2017-02-06 DIAGNOSIS — M810 Age-related osteoporosis without current pathological fracture: Secondary | ICD-10-CM

## 2017-02-06 DIAGNOSIS — E785 Hyperlipidemia, unspecified: Secondary | ICD-10-CM

## 2017-02-06 DIAGNOSIS — Z0000 Encounter for general adult medical examination without abnormal findings: Secondary | ICD-10-CM | POA: Diagnosis not present

## 2017-02-06 MED ORDER — LISINOPRIL-HYDROCHLOROTHIAZIDE 20-12.5 MG PO TABS: 1.0000 | ORAL_TABLET | Freq: Every day | ORAL | 1 refills | Status: DC

## 2017-02-06 MED ORDER — VARENICLINE TARTRATE 0.5 MG X 11 & 1 MG X 42 PO MISC: ORAL | 0 refills | Status: DC

## 2017-02-06 MED ORDER — RISEDRONATE SODIUM 150 MG PO TABS: 150.0000 mg | ORAL_TABLET | ORAL | 3 refills | Status: DC

## 2017-02-06 MED ORDER — ROSUVASTATIN CALCIUM 10 MG PO TABS: 10.0000 mg | ORAL_TABLET | Freq: Every day | ORAL | 1 refills | Status: DC

## 2017-02-06 NOTE — Progress Notes (Signed)
Subjective:    Patient ID: Lauren Dunn, female    DOB: Sep 19, 1950, 66 y.o.   MRN: [id number]  HPI Lauren Dunn is here today for a comprehensive physical exam and follow up of chronic medical problem.  Outpatient Encounter Prescriptions as of 02/06/2017  Medication Sig  . lisinopril-hydrochlorothiazide (PRINZIDE,ZESTORETIC) 20-12.5 MG tablet TAKE 1 TABLET BY MOUTH ONCE DAILY  . risedronate (ACTONEL) 150 MG tablet Take 1 tablet (150 mg total) by mouth every 30 (thirty) days. with water on empty stomach, nothing by mouth or lie down for next 30 minutes.  . rosuvastatin (CRESTOR) 10 MG tablet TAKE 1 TABLET BY MOUTH ONCE DAILY   No facility-administered encounter medications on file as of 02/06/2017.     1. Essential hypertension  Blood pressure managed with combination lisinopril-HCTZ.  Patient does not check blood pressure at home.  2. Age-related osteoporosis without current pathological fracture  Patient taking Actonel monthly.  No new complaints.  3. Hyperlipidemia with target LDL less than 100  Managed with rosuvastatin.  LFTs checked regularly.  4. BMI 28.0-28.9,adult  No significant weight gain or loss.    New complaints: Wants to stop smoking- would like to try chantix  Social history: Retired from [employer]  Constitutional: Negative for activity change, appetite change and fatigue.  Respiratory: Negative for cough, shortness of breath and wheezing.   Cardiovascular: Negative for chest pain and palpitations.  Gastrointestinal: Negative for abdominal pain.  Musculoskeletal: Positive for back pain (chronic).  Neurological: Negative for dizziness, light-headedness and headaches.  All other systems reviewed and are negative.      Objective:   Physical Exam  Constitutional: She is oriented to person, place, and time. She appears well-developed and well-nourished. No distress.  HENT:  Head: Normocephalic.  Right Ear: External ear normal.  Left Ear:  External ear normal.  Nose: Nose normal.  Mouth/Throat: Oropharynx is clear and moist.  Eyes: Pupils are equal, round, and reactive to light. Conjunctivae and EOM are normal.  Neck: Normal range of motion. Neck supple. No thyromegaly present.  Cardiovascular: Normal rate, regular rhythm, normal heart sounds and intact distal pulses.   No murmur heard. Pulmonary/Chest: Effort normal and breath sounds normal. No respiratory distress. She has no wheezes.  Abdominal: Soft. Bowel sounds are normal. She exhibits no distension. There is no tenderness.  Musculoskeletal: Normal range of motion. She exhibits no edema.  Lymphadenopathy:    She has no cervical adenopathy.  Neurological: She is alert and oriented to person, place, and time.  Skin: Skin is warm and dry.  Psychiatric: She has a normal mood and affect. Her behavior is normal.   BP 133/84   Pulse 74   Temp 98.4 F (36.9 C) (Oral)   Ht '5\' 3"'  (1.6 m)   Wt 162 lb (73.5 kg)   BMI 28.70 kg/m     Assessment & Plan:  1. ANNUAL PHYSICAL - CBC with Differential/Platelet - Thyroid Panel With TSH  2. Essential hypertension Low sodium diet - lisinopril-hydrochlorothiazide (PRINZIDE,ZESTORETIC) 20-12.5 MG tablet; Take 1 tablet by mouth daily.  Dispense: 90 tablet; Refill: 1 - CMP14+EGFR  3. Age-related osteoporosis without current pathological fracture Weight bearing exercises - risedronate (ACTONEL) 150 MG tablet; Take 1 tablet (150 mg total) by mouth every 30 (thirty) days. with water on empty stomach, nothing by mouth or lie down for next 30 minutes.  Dispense: 3 tablet; Refill: 3  4. Hyperlipidemia with target LDL less than 100 Low fat  diet - rosuvastatin (CRESTOR) 10 MG tablet; Take 1 tablet (10 mg total) by mouth daily.  Dispense: 90 tablet; Refill: 1 - Lipid panel  5. BMI 28.0-28.9,adult Discussed diet and exercise for person with BMI >25 Will recheck weight in 3-6 months  6. Smoker Smoke thE first 13 days of taking meds-  then stop COLD [place] - varenicline (CHANTIX STARTING MONTH PAK) 0.5 MG X 11 & 1 MG X 42 tablet; Take one 0.5 mg tablet by mouth once daily for 3 days, then increase to one 0.5 mg tablet twice daily for 4 days, then increase to one 1 mg tablet twice daily.  Dispense: 53 tablet; Refill: 0    Labs pending Health maintenance reviewed Diet and exercise encouraged Continue all meds Follow up  [place], FNP

## 2017-02-06 NOTE — Patient Instructions (Signed)
Bone Health Bones protect organs, store calcium, and anchor muscles. Good health habits, such as eating nutritious foods and exercising regularly, are important for maintaining healthy bones. They can also help to prevent a condition that causes bones to lose density and become weak and brittle (osteoporosis). Why is bone mass important? Bone mass refers to the amount of bone tissue that you have. The higher your bone mass, the stronger your bones. An important step toward having healthy bones throughout life is to have strong and dense bones during childhood. A young adult who has a high bone mass is more likely to have a high bone mass later in life. Bone mass at its greatest it is called peak bone mass. A large decline in bone mass occurs in older adults. In women, it occurs about the time of menopause. During this time, it is important to practice good health habits, because if more bone is lost than what is replaced, the bones will become less healthy and more likely to break (fracture). If you find that you have a low bone mass, you may be able to prevent osteoporosis or further bone loss by changing your diet and lifestyle. How can I find out if my bone mass is low? Bone mass can be measured with an X-ray test that is called a bone mineral density (BMD) test. This test is recommended for all women who are age 65 or older. It may also be recommended for men who are age 70 or older, or for people who are more likely to develop osteoporosis due to:  Having bones that break easily.  Having a long-term disease that weakens bones, such as kidney disease or rheumatoid arthritis.  Having menopause earlier than normal.  Taking medicine that weakens bones, such as steroids, thyroid hormones, or hormone treatment for breast cancer or prostate cancer.  Smoking.  Drinking three or more alcoholic drinks each day.  What are the nutritional recommendations for healthy bones? To have healthy bones, you  need to get enough of the right minerals and vitamins. Most nutrition experts recommend getting these nutrients from the foods that you eat. Nutritional recommendations vary from person to person. Ask your health care provider what is healthy for you. Here are some general guidelines. Calcium Recommendations Calcium is the most important (essential) mineral for bone health. Most people can get enough calcium from their diet, but supplements may be recommended for people who are at risk for osteoporosis. Good sources of calcium include:  Dairy products, such as low-fat or nonfat milk, cheese, and yogurt.  Dark green leafy vegetables, such as bok choy and broccoli.  Calcium-fortified foods, such as orange juice, cereal, bread, soy beverages, and tofu products.  Nuts, such as almonds.  Follow these recommended amounts for daily calcium intake:  Children, age 1?3: 700 mg.  Children, age 4?8: 1,000 mg.  Children, age 9?13: 1,300 mg.  Teens, age 14?18: 1,300 mg.  Adults, age 19?50: 1,000 mg.  Adults, age 51?70: ? Men: 1,000 mg. ? Women: 1,200 mg.  Adults, age 71 or older: 1,200 mg.  Pregnant and breastfeeding females: ? Teens: 1,300 mg. ? Adults: 1,000 mg.  Vitamin D Recommendations Vitamin D is the most essential vitamin for bone health. It helps the body to absorb calcium. Sunlight stimulates the skin to make vitamin D, so be sure to get enough sunlight. If you live in a cold climate or you do not get outside often, your health care provider may recommend that you take vitamin   D supplements. Good sources of vitamin D in your diet include:  Egg yolks.  Saltwater fish.  Milk and cereal fortified with vitamin D.  Follow these recommended amounts for daily vitamin D intake:  Children and teens, age 1?18: 600 international units.  Adults, age 50 or younger: 400-800 international units.  Adults, age 51 or older: 800-1,000 international units.  Other Nutrients Other nutrients  for bone health include:  Phosphorus. This mineral is found in meat, poultry, dairy foods, nuts, and legumes. The recommended daily intake for adult men and adult women is 700 mg.  Magnesium. This mineral is found in seeds, nuts, dark green vegetables, and legumes. The recommended daily intake for adult men is 400?420 mg. For adult women, it is 310?320 mg.  Vitamin K. This vitamin is found in green leafy vegetables. The recommended daily intake is 120 mg for adult men and 90 mg for adult women.  What type of physical activity is best for building and maintaining healthy bones? Weight-bearing and strength-building activities are important for building and maintaining peak bone mass. Weight-bearing activities cause muscles and bones to work against gravity. Strength-building activities increases muscle strength that supports bones. Weight-bearing and muscle-building activities include:  Walking and hiking.  Jogging and running.  Dancing.  Gym exercises.  Lifting weights.  Tennis and racquetball.  Climbing stairs.  Aerobics.  Adults should get at least 30 minutes of moderate physical activity on most days. Children should get at least 60 minutes of moderate physical activity on most days. Ask your health care provide what type of exercise is best for you. Where can I find more information? For more information, check out the following websites:  National Osteoporosis Foundation: http://nof.org/learn/basics  National Institutes of Health: http://www.niams.nih.gov/Health_Info/Bone/Bone_Health/bone_health_for_life.asp  This information is not intended to replace advice given to you by your health care provider. Make sure you discuss any questions you have with your health care provider. Document Released: 08/03/2003 Document Revised: 12/01/2015 Document Reviewed: 05/18/2014 Elsevier Interactive Patient Education  2018 Elsevier Inc.  

## 2017-02-07 LAB — CBC WITH DIFFERENTIAL/PLATELET
BASOS ABS: 0.1 10*3/uL (ref 0.0–0.2)
BASOS: 1 %
EOS (ABSOLUTE): 0.3 10*3/uL (ref 0.0–0.4)
Eos: 4 %
Hematocrit: 37.1 % (ref 34.0–46.6)
Hemoglobin: 12.4 g/dL (ref 11.1–15.9)
IMMATURE GRANULOCYTES: 0 %
Immature Grans (Abs): 0 10*3/uL (ref 0.0–0.1)
LYMPHS: 30 %
Lymphocytes Absolute: 1.8 10*3/uL (ref 0.7–3.1)
MCH: 30 pg (ref 26.6–33.0)
MCHC: 33.4 g/dL (ref 31.5–35.7)
MCV: 90 fL (ref 79–97)
MONOS ABS: 0.5 10*3/uL (ref 0.1–0.9)
Monocytes: 8 %
NEUTROS PCT: 57 %
Neutrophils Absolute: 3.5 10*3/uL (ref 1.4–7.0)
PLATELETS: 271 10*3/uL (ref 150–379)
RBC: 4.13 x10E6/uL (ref 3.77–5.28)
RDW: 15 % (ref 12.3–15.4)
WBC: 6.1 10*3/uL (ref 3.4–10.8)

## 2017-02-07 LAB — THYROID PANEL WITH TSH
Free Thyroxine Index: 2 (ref 1.2–4.9)
T3 UPTAKE RATIO: 24 % (ref 24–39)
T4 TOTAL: 8.2 ug/dL (ref 4.5–12.0)
TSH: 1.91 u[IU]/mL (ref 0.450–4.500)

## 2017-02-07 LAB — CMP14+EGFR
A/G RATIO: 2 (ref 1.2–2.2)
ALK PHOS: 59 IU/L (ref 39–117)
ALT: 19 IU/L (ref 0–32)
AST: 23 IU/L (ref 0–40)
Albumin: 4.5 g/dL (ref 3.6–4.8)
BUN/Creatinine Ratio: 17 (ref 12–28)
BUN: 13 mg/dL (ref 8–27)
Bilirubin Total: 0.3 mg/dL (ref 0.0–1.2)
CALCIUM: 9.5 mg/dL (ref 8.7–10.3)
CO2: 24 mmol/L (ref 20–29)
Chloride: 104 mmol/L (ref 96–106)
Creatinine, Ser: 0.78 mg/dL (ref 0.57–1.00)
GFR calc Af Amer: 91 mL/min/{1.73_m2} (ref >59)
GFR, EST NON AFRICAN AMERICAN: 79 mL/min/{1.73_m2} (ref >59)
Globulin, Total: 2.2 g/dL (ref 1.5–4.5)
Glucose: 89 mg/dL (ref 65–99)
POTASSIUM: 4.3 mmol/L (ref 3.5–5.2)
SODIUM: 141 mmol/L (ref 134–144)
Total Protein: 6.7 g/dL (ref 6.0–8.5)

## 2017-02-07 LAB — LIPID PANEL
CHOLESTEROL TOTAL: 147 mg/dL (ref 100–199)
Chol/HDL Ratio: 3.5 ratio (ref 0.0–4.4)
HDL: 42 mg/dL (ref >39)
LDL Calculated: 69 mg/dL (ref 0–99)
Triglycerides: 180 mg/dL — ABNORMAL HIGH (ref 0–149)
VLDL CHOLESTEROL CAL: 36 mg/dL (ref 5–40)

## 2017-04-22 ENCOUNTER — Encounter: Payer: Self-pay | Admitting: Internal Medicine

## 2017-05-27 HISTORY — PX: COLONOSCOPY: SHX174

## 2017-08-28 ENCOUNTER — Other Ambulatory Visit: Payer: Self-pay | Admitting: Nurse Practitioner

## 2017-08-28 DIAGNOSIS — I10 Essential (primary) hypertension: Secondary | ICD-10-CM

## 2017-08-28 NOTE — Telephone Encounter (Signed)
Last refill without being seen 

## 2017-08-28 NOTE — Telephone Encounter (Signed)
Last seen 02/06/17  MMM

## 2017-08-30 ENCOUNTER — Other Ambulatory Visit: Payer: Self-pay | Admitting: Nurse Practitioner

## 2017-08-30 DIAGNOSIS — I10 Essential (primary) hypertension: Secondary | ICD-10-CM

## 2017-09-19 ENCOUNTER — Telehealth: Payer: Self-pay | Admitting: Nurse Practitioner

## 2017-09-22 NOTE — Telephone Encounter (Signed)
Pt given appt with MMM 10/03/17 at 2:00.

## 2017-09-22 NOTE — Telephone Encounter (Signed)
Yes she needs followup

## 2017-10-03 ENCOUNTER — Encounter: Payer: Self-pay | Admitting: Nurse Practitioner

## 2017-10-03 ENCOUNTER — Ambulatory Visit: Payer: Medicare Other | Admitting: Nurse Practitioner

## 2017-10-03 VITALS — BP 134/84 | HR 78 | Temp 97.5°F | Ht 63.0 in | Wt 156.4 lb

## 2017-10-03 DIAGNOSIS — M810 Age-related osteoporosis without current pathological fracture: Secondary | ICD-10-CM | POA: Diagnosis not present

## 2017-10-03 DIAGNOSIS — F17200 Nicotine dependence, unspecified, uncomplicated: Secondary | ICD-10-CM | POA: Diagnosis not present

## 2017-10-03 DIAGNOSIS — E785 Hyperlipidemia, unspecified: Secondary | ICD-10-CM

## 2017-10-03 DIAGNOSIS — Z6828 Body mass index (BMI) 28.0-28.9, adult: Secondary | ICD-10-CM | POA: Diagnosis not present

## 2017-10-03 DIAGNOSIS — I10 Essential (primary) hypertension: Secondary | ICD-10-CM | POA: Diagnosis not present

## 2017-10-03 DIAGNOSIS — Z86010 Personal history of colonic polyps: Secondary | ICD-10-CM | POA: Diagnosis not present

## 2017-10-03 MED ORDER — ROSUVASTATIN CALCIUM 10 MG PO TABS: 10.0000 mg | ORAL_TABLET | Freq: Every day | ORAL | 1 refills | Status: DC

## 2017-10-03 MED ORDER — LISINOPRIL-HYDROCHLOROTHIAZIDE 20-12.5 MG PO TABS: 1.0000 | ORAL_TABLET | Freq: Every day | ORAL | 1 refills | Status: DC

## 2017-10-03 MED ORDER — RISEDRONATE SODIUM 150 MG PO TABS: 150.0000 mg | ORAL_TABLET | ORAL | 3 refills | Status: DC

## 2017-10-03 NOTE — Patient Instructions (Signed)
Bone Health Bones protect organs, store calcium, and anchor muscles. Good health habits, such as eating nutritious foods and exercising regularly, are important for maintaining healthy bones. They can also help to prevent a condition that causes bones to lose density and become weak and brittle (osteoporosis). Why is bone mass important? Bone mass refers to the amount of bone tissue that you have. The higher your bone mass, the stronger your bones. An important step toward having healthy bones throughout life is to have strong and dense bones during childhood. A young adult who has a high bone mass is more likely to have a high bone mass later in life. Bone mass at its greatest it is called peak bone mass. A large decline in bone mass occurs in older adults. In women, it occurs about the time of menopause. During this time, it is important to practice good health habits, because if more bone is lost than what is replaced, the bones will become less healthy and more likely to break (fracture). If you find that you have a low bone mass, you may be able to prevent osteoporosis or further bone loss by changing your diet and lifestyle. How can I find out if my bone mass is low? Bone mass can be measured with an X-ray test that is called a bone mineral density (BMD) test. This test is recommended for all women who are age 65 or older. It may also be recommended for men who are age 70 or older, or for people who are more likely to develop osteoporosis due to:  Having bones that break easily.  Having a long-term disease that weakens bones, such as kidney disease or rheumatoid arthritis.  Having menopause earlier than normal.  Taking medicine that weakens bones, such as steroids, thyroid hormones, or hormone treatment for breast cancer or prostate cancer.  Smoking.  Drinking three or more alcoholic drinks each day.  What are the nutritional recommendations for healthy bones? To have healthy bones, you  need to get enough of the right minerals and vitamins. Most nutrition experts recommend getting these nutrients from the foods that you eat. Nutritional recommendations vary from person to person. Ask your health care provider what is healthy for you. Here are some general guidelines. Calcium Recommendations Calcium is the most important (essential) mineral for bone health. Most people can get enough calcium from their diet, but supplements may be recommended for people who are at risk for osteoporosis. Good sources of calcium include:  Dairy products, such as low-fat or nonfat milk, cheese, and yogurt.  Dark green leafy vegetables, such as bok choy and broccoli.  Calcium-fortified foods, such as orange juice, cereal, bread, soy beverages, and tofu products.  Nuts, such as almonds.  Follow these recommended amounts for daily calcium intake:  Children, age 1?3: 700 mg.  Children, age 4?8: 1,000 mg.  Children, age 9?13: 1,300 mg.  Teens, age 14?18: 1,300 mg.  Adults, age 19?50: 1,000 mg.  Adults, age 51?70: ? Men: 1,000 mg. ? Women: 1,200 mg.  Adults, age 71 or older: 1,200 mg.  Pregnant and breastfeeding females: ? Teens: 1,300 mg. ? Adults: 1,000 mg.  Vitamin D Recommendations Vitamin D is the most essential vitamin for bone health. It helps the body to absorb calcium. Sunlight stimulates the skin to make vitamin D, so be sure to get enough sunlight. If you live in a cold climate or you do not get outside often, your health care provider may recommend that you take vitamin   D supplements. Good sources of vitamin D in your diet include:  Egg yolks.  Saltwater fish.  Milk and cereal fortified with vitamin D.  Follow these recommended amounts for daily vitamin D intake:  Children and teens, age 1?18: 600 international units.  Adults, age 50 or younger: 400-800 international units.  Adults, age 51 or older: 800-1,000 international units.  Other Nutrients Other nutrients  for bone health include:  Phosphorus. This mineral is found in meat, poultry, dairy foods, nuts, and legumes. The recommended daily intake for adult men and adult women is 700 mg.  Magnesium. This mineral is found in seeds, nuts, dark green vegetables, and legumes. The recommended daily intake for adult men is 400?420 mg. For adult women, it is 310?320 mg.  Vitamin K. This vitamin is found in green leafy vegetables. The recommended daily intake is 120 mg for adult men and 90 mg for adult women.  What type of physical activity is best for building and maintaining healthy bones? Weight-bearing and strength-building activities are important for building and maintaining peak bone mass. Weight-bearing activities cause muscles and bones to work against gravity. Strength-building activities increases muscle strength that supports bones. Weight-bearing and muscle-building activities include:  Walking and hiking.  Jogging and running.  Dancing.  Gym exercises.  Lifting weights.  Tennis and racquetball.  Climbing stairs.  Aerobics.  Adults should get at least 30 minutes of moderate physical activity on most days. Children should get at least 60 minutes of moderate physical activity on most days. Ask your health care provide what type of exercise is best for you. Where can I find more information? For more information, check out the following websites:  National Osteoporosis Foundation: http://nof.org/learn/basics  National Institutes of Health: http://www.niams.nih.gov/Health_Info/Bone/Bone_Health/bone_health_for_life.asp  This information is not intended to replace advice given to you by your health care provider. Make sure you discuss any questions you have with your health care provider. Document Released: 08/03/2003 Document Revised: 12/01/2015 Document Reviewed: 05/18/2014 Elsevier Interactive Patient Education  2018 Elsevier Inc.  

## 2017-10-03 NOTE — Progress Notes (Signed)
Subjective:    Patient ID: Lauren Dunn, female    DOB: 09-Nov-1950, 67 y.o.   MRN: [id number]   Chief Complaint: No chief complaint on file.   HPI:  1. Essential hypertension  No c/o chest pain, sob or headache. She does not check her blood pressure at home. BP Readings from Last 3 Encounters:  02/06/17 133/84  11/11/16 123/76  11/03/16 137/81     2. Hyperlipidemia with target LDL less than 100  Does not really watch diet and does very little exercise.  3. Age-related osteoporosis without current pathological fracture Last dexascan was done with t score of -2.7. She is currently on actonel without any problems.  4. BMI 28.0-28.9,adult  No recent weight changes  5. Personal history of colonic polyps  She has colonoscopy 5 years ago and is due for repeat. She denies any bloodin her stools that she can see.     Outpatient Encounter Medications as of 10/03/2017  Medication Sig  . lisinopril-hydrochlorothiazide (PRINZIDE,ZESTORETIC) 20-12.5 MG tablet TAKE 1 TABLET BY MOUTH ONCE DAILY  . risedronate (ACTONEL) 150 MG tablet Take 1 tablet (150 mg total) by mouth every 30 (thirty) days. with water on empty stomach, nothing by mouth or lie down for next 30 minutes.  . rosuvastatin (CRESTOR) 10 MG tablet Take 1 tablet (10 mg total) by mouth daily.  . varenicline (CHANTIX STARTING MONTH PAK) 0.5 MG X 11 & 1 MG X 42 tablet Take one 0.5 mg tablet by mouth once daily for 3 days, then increase to one 0.5 mg tablet twice daily for 4 days, then increase to one 1 mg tablet twice daily.      New complaints: None today  Social history: Lives alone * still smoke at least 1pack a day    Review of Systems  Constitutional: Negative for activity change and appetite change.  HENT: Negative.   Eyes: Negative for pain.  Respiratory: Negative for shortness of breath.   Cardiovascular: Negative for chest pain, palpitations and leg swelling.  Gastrointestinal: Negative for abdominal pain.    Endocrine: Negative for polydipsia.  Genitourinary: Negative.   Skin: Negative for rash.  Neurological: Negative for dizziness, weakness and headaches.  Hematological: Does not bruise/bleed easily.  Psychiatric/Behavioral: Negative.   All other systems reviewed and are negative.      Objective:   Physical Exam  Constitutional: She is oriented to person, place, and time. She appears well-nourished. No distress.  HENT:  Head: Normocephalic.  Nose: Nose normal.  Mouth/Throat: Oropharynx is clear and moist.  Eyes: Pupils are equal, round, and reactive to light. EOM are normal.  Neck: Normal range of motion. Neck supple. No JVD present. Carotid bruit is not present.  Cardiovascular: Normal rate, regular rhythm, normal heart sounds and intact distal pulses.  Pulmonary/Chest: Effort normal and breath sounds normal. No respiratory distress. She has no wheezes. She has no rales. She exhibits no tenderness.  Abdominal: Soft. Normal appearance, normal aorta and bowel sounds are normal. She exhibits no distension, no abdominal bruit, no pulsatile midline mass and no mass. There is no splenomegaly or hepatomegaly. There is no tenderness.  Musculoskeletal: Normal range of motion. She exhibits no edema.  Lymphadenopathy:    She has no cervical adenopathy.  Neurological: She is alert and oriented to person, place, and time. She has normal reflexes.  Skin: Skin is warm and dry.  Psychiatric: She has a normal mood and affect. Her behavior is normal. Judgment and thought content normal.  BP 134/84   Pulse 78   Temp (!) 97.5 F (36.4 C) (Oral)   Ht _0  (1.6 m)   Wt 156 lb 6.4 oz (70.9 kg)   BMI 27.71 kg/m        Assessment & Plan:  Lauren Dunn comes in today with chief complaint of Medical Management of Chronic Issues and referral for colonoscopy   Diagnosis and orders addressed:  1. Essential hypertension Low sodium diet - CMP14+EGFR - lisinopril-hydrochlorothiazide  (PRINZIDE,ZESTORETIC) 20-12.5 MG tablet; Take 1 tablet by mouth daily.  Dispense: 90 tablet; Refill: 1  2. Hyperlipidemia with target LDL less than 100 Low fat diet - Lipid panel - rosuvastatin (CRESTOR) 10 MG tablet; Take 1 tablet (10 mg total) by mouth daily.  Dispense: 90 tablet; Refill: 1  3. Age-related osteoporosis without current pathological fracture Weight bearing exercises encouraged  4. BMI 28.0-28.9,adult Discussed diet and exercise for person with BMI >25 Will recheck weight in 3-6 months  5. Personal history of colonic polyps - Ambulatory referral to Gastroenterology  6. Smoker Encouraged to stop smoking   Labs pending Health Maintenance reviewed Diet and exercise encouraged  Follow up plan:  3 months   [name], FNP

## 2017-10-04 LAB — CMP14+EGFR
A/G RATIO: 2 (ref 1.2–2.2)
ALT: 18 IU/L (ref 0–32)
AST: 24 IU/L (ref 0–40)
Albumin: 4.5 g/dL (ref 3.6–4.8)
Alkaline Phosphatase: 50 IU/L (ref 39–117)
BUN/Creatinine Ratio: 19 (ref 12–28)
BUN: 15 mg/dL (ref 8–27)
Bilirubin Total: 0.3 mg/dL (ref 0.0–1.2)
CALCIUM: 10.1 mg/dL (ref 8.7–10.3)
CO2: 26 mmol/L (ref 20–29)
CREATININE: 0.79 mg/dL (ref 0.57–1.00)
Chloride: 100 mmol/L (ref 96–106)
GFR, EST AFRICAN AMERICAN: 90 mL/min/{1.73_m2} (ref >59)
GFR, EST NON AFRICAN AMERICAN: 78 mL/min/{1.73_m2} (ref >59)
Globulin, Total: 2.3 g/dL (ref 1.5–4.5)
Glucose: 70 mg/dL (ref 65–99)
POTASSIUM: 4.1 mmol/L (ref 3.5–5.2)
Sodium: 141 mmol/L (ref 134–144)
TOTAL PROTEIN: 6.8 g/dL (ref 6.0–8.5)

## 2017-10-04 LAB — LIPID PANEL
CHOL/HDL RATIO: 3.5 ratio (ref 0.0–4.4)
Cholesterol, Total: 155 mg/dL (ref 100–199)
HDL: 44 mg/dL (ref >39)
LDL CALC: 61 mg/dL (ref 0–99)
TRIGLYCERIDES: 250 mg/dL — AB (ref 0–149)
VLDL CHOLESTEROL CAL: 50 mg/dL — AB (ref 5–40)

## 2017-11-28 ENCOUNTER — Encounter: Payer: Self-pay | Admitting: Internal Medicine

## 2017-12-12 ENCOUNTER — Telehealth: Payer: Self-pay | Admitting: *Deleted

## 2017-12-12 NOTE — Telephone Encounter (Signed)
Pt is having side effects from Actonel Pt had GI upset, flu like symptoms and chills after taking Actonel This has happened the last 2 times pt took med Pt would like to try something else Please advise

## 2017-12-18 ENCOUNTER — Other Ambulatory Visit: Payer: Self-pay | Admitting: Nurse Practitioner

## 2017-12-18 MED ORDER — ALENDRONATE SODIUM 70 MG PO TABS: 70.0000 mg | ORAL_TABLET | ORAL | 11 refills | Status: AC

## 2017-12-18 NOTE — Telephone Encounter (Signed)
Can try fosamax which is also weekly

## 2017-12-18 NOTE — Telephone Encounter (Signed)
Patient states that she will try fosamax. Please send to pharmacy

## 2018-02-02 ENCOUNTER — Ambulatory Visit (AMBULATORY_SURGERY_CENTER): Payer: Self-pay | Admitting: *Deleted

## 2018-02-02 VITALS — Ht 63.0 in | Wt 158.0 lb

## 2018-02-02 DIAGNOSIS — Z86010 Personal history of colonic polyps: Secondary | ICD-10-CM

## 2018-02-02 NOTE — Progress Notes (Signed)
Patient denies any allergies to eggs or soy. Patient denies any problems with anesthesia/sedation. Patient denies any oxygen use at home. Patient denies taking any diet/weight loss medications or blood thinners. EMMI education offered, pt declined.  

## 2018-02-04 ENCOUNTER — Encounter: Payer: Self-pay | Admitting: Internal Medicine

## 2018-02-18 ENCOUNTER — Encounter: Payer: Self-pay | Admitting: Internal Medicine

## 2018-02-18 ENCOUNTER — Ambulatory Visit (AMBULATORY_SURGERY_CENTER): Payer: Medicare Other | Admitting: Internal Medicine

## 2018-02-18 VITALS — BP 113/71 | HR 66 | Temp 96.8°F | Resp 15 | Ht 63.0 in | Wt 158.0 lb

## 2018-02-18 DIAGNOSIS — D122 Benign neoplasm of ascending colon: Secondary | ICD-10-CM | POA: Diagnosis not present

## 2018-02-18 DIAGNOSIS — Z86010 Personal history of colonic polyps: Secondary | ICD-10-CM | POA: Diagnosis present

## 2018-02-18 DIAGNOSIS — D125 Benign neoplasm of sigmoid colon: Secondary | ICD-10-CM

## 2018-02-18 DIAGNOSIS — D120 Benign neoplasm of cecum: Secondary | ICD-10-CM | POA: Diagnosis not present

## 2018-02-18 MED ORDER — SODIUM CHLORIDE 0.9 % IV SOLN: 500.0000 mL | Freq: Once | INTRAVENOUS | Status: DC

## 2018-02-18 NOTE — Op Note (Signed)
[place] Patient Name: Lauren Dunn Procedure Date: 02/18/2018 9:25 AM MRN: [id number] Endoscopist: Gatha Mayer , MD Age: 67 Referring MD:  Date of Birth: Nov 30, 1950 Gender: Female Account #: [id number] Procedure:                Colonoscopy Indications:              Surveillance: Personal history of adenomatous                            polyps on last colonoscopy > 5 years ago Medicines:                Propofol per Anesthesia, Monitored Anesthesia Care Procedure:                Pre-Anesthesia Assessment:                           - Prior to the procedure, a History and Physical                            was performed, and patient medications and                            allergies were reviewed. The patient's tolerance of                            previous anesthesia was also reviewed. The risks                            and benefits of the procedure and the sedation                            options and risks were discussed with the patient.                            All questions were answered, and informed consent                            was obtained. Prior Anticoagulants: The patient has                            taken no previous anticoagulant or antiplatelet                            agents. ASA Grade Assessment: II - A patient with                            mild systemic disease. After reviewing the risks                            and benefits, the patient was deemed in                            satisfactory condition to undergo the procedure.  After obtaining informed consent, the colonoscope                            was passed under direct vision. Throughout the                            procedure, the patient's blood pressure, pulse, and                            oxygen saturations were monitored continuously. The                            Colonoscope was introduced through the anus and                             advanced to the the cecum, identified by                            appendiceal orifice and ileocecal valve. The                            colonoscopy was performed without difficulty. The                            patient tolerated the procedure well. The quality                            of the bowel preparation was good. The ileocecal                            valve, appendiceal orifice, and rectum were                            photographed. The bowel preparation used was                            Miralax. Scope In: 9:31:18 AM Scope Out: 9:48:12 AM Scope Withdrawal Time: 0 hours 13 minutes 59 seconds  Total Procedure Duration: 0 hours 16 minutes 54 seconds  Findings:                 The perianal and digital rectal examinations were                            normal.                           Four sessile polyps were found in the sigmoid                            colon, ascending colon and cecum. The polyps were                            diminutive in size. These polyps were removed with  a cold snare. Resection and retrieval were                            complete. Verification of patient identification                            for the specimen was done. Estimated blood loss was                            minimal.                           The exam was otherwise without abnormality on                            direct and retroflexion views. Estimated Blood Loss:     Estimated blood loss was minimal. Impression:               - Four diminutive polyps in the sigmoid colon, in                            the ascending colon and in the cecum, removed with                            a cold snare. Resected and retrieved.                           - The examination was otherwise normal on direct                            and retroflexion views.                           - Personal history of colonic polyp 8 mm adenoma                             2013. Recommendation:           - Patient has a contact number available for                            emergencies. The signs and symptoms of potential                            delayed complications were discussed with the                            patient. Return to normal activities tomorrow.                            Written discharge instructions were provided to the                            patient.                           -  Resume previous diet.                           - Continue present medications.                           - Repeat colonoscopy is recommended for                            surveillance. The colonoscopy date will be                            determined after pathology results from today's                            exam become available for review. Gatha Mayer, MD 02/18/2018 9:56:58 AM This report has been signed electronically.

## 2018-02-18 NOTE — Progress Notes (Signed)
Called to room to assist during endoscopic procedure.  Patient ID and intended procedure confirmed with present staff. Received instructions for my participation in the procedure from the performing physician.  

## 2018-02-18 NOTE — Progress Notes (Signed)
Pt's states no medical or surgical changes since previsit or office visit. 

## 2018-02-18 NOTE — Patient Instructions (Addendum)
   I found and removed 4 tiny polyps.  I will let you know pathology results and when to have another routine colonoscopy by mail and/or My Chart.  I appreciate the opportunity to care for you. Gatha Mayer, MD, [facility]  Handout given on polyps *   YOU HAD AN ENDOSCOPIC PROCEDURE TODAY AT [facility]:   Refer to the procedure report that was given to you for any specific questions about what was found during the examination.  If the procedure report does not answer your questions, please call your gastroenterologist to clarify.  If you requested that your care partner not be given the details of your procedure findings, then the procedure report has been included in a sealed envelope for you to review at your convenience later.  YOU SHOULD EXPECT: Some feelings of bloating in the abdomen. Passage of more gas than usual.  Walking can help get rid of the air that was put into your GI tract during the procedure and reduce the bloating. If you had a lower endoscopy (such as a colonoscopy or flexible sigmoidoscopy) you may notice spotting of blood in your stool or on the toilet paper. If you underwent a bowel prep for your procedure, you may not have a normal bowel movement for a few days.  Please Note:  You might notice some irritation and congestion in your nose or some drainage.  This is from the oxygen used during your procedure.  There is no need for concern and it should clear up in a day or so.  SYMPTOMS TO REPORT IMMEDIATELY:   Following lower endoscopy (colonoscopy or flexible sigmoidoscopy):  Excessive amounts of blood in the stool  Significant tenderness or worsening of abdominal pains  Swelling of the abdomen that is new, acute  Fever of 100F or higher For urgent or emergent issues, a gastroenterologist can be reached at any hour by calling [phone].   DIET:  We do recommend a small meal at first, but then you may proceed to your regular diet.  Drink  plenty of fluids but you should avoid alcoholic beverages for 24 hours.  ACTIVITY:  You should plan to take it easy for the rest of today and you should NOT DRIVE or use heavy machinery until tomorrow (because of the sedation medicines used during the test).    FOLLOW UP: Our staff will call the number listed on your records the next business day following your procedure to check on you and address any questions or concerns that you may have regarding the information given to you following your procedure. If we do not reach you, we will leave a message.  However, if you are feeling well and you are not experiencing any problems, there is no need to return our call.  We will assume that you have returned to your regular daily activities without incident.  If any biopsies were taken you will be contacted by phone or by letter within the next 1-3 weeks.  Please call us at [phone] if you have not heard about the biopsies in 3 weeks.    SIGNATURES/CONFIDENTIALITY: You and/or your care partner have signed paperwork which will be entered into your electronic medical record.  These signatures attest to the fact that that the information above on your After Visit Summary has been reviewed and is understood.  Full responsibility of the confidentiality of this discharge information lies with you and/or your care-partner.

## 2018-02-19 ENCOUNTER — Telehealth: Payer: Self-pay

## 2018-02-19 NOTE — Telephone Encounter (Signed)
  Follow up Call-  Call back number 02/18/2018  Post procedure Call Back phone  # [phone]  Permission to leave phone message Yes  Some recent data might be hidden     Patient questions:  Do you have a fever, pain , or abdominal swelling? No. Pain Score  0 *  Have you tolerated food without any problems? Yes.    Have you been able to return to your normal activities? Yes.    Do you have any questions about your discharge instructions: Diet   No. Medications  No. Follow up visit  No.  Do you have questions or concerns about your Care? No.  Actions: * If pain score is 4 or above: No action needed, pain <4.

## 2018-03-04 ENCOUNTER — Encounter: Payer: Self-pay | Admitting: Internal Medicine

## 2018-03-04 DIAGNOSIS — Z86010 Personal history of colonic polyps: Secondary | ICD-10-CM

## 2018-03-04 NOTE — Progress Notes (Signed)
3 adenomas + 1 ssp Recall 2022

## 2018-07-06 ENCOUNTER — Other Ambulatory Visit: Payer: Self-pay | Admitting: Nurse Practitioner

## 2018-07-06 DIAGNOSIS — E785 Hyperlipidemia, unspecified: Secondary | ICD-10-CM

## 2018-07-06 DIAGNOSIS — I10 Essential (primary) hypertension: Secondary | ICD-10-CM

## 2018-07-17 ENCOUNTER — Encounter: Payer: Self-pay | Admitting: Nurse Practitioner

## 2018-07-17 ENCOUNTER — Ambulatory Visit (INDEPENDENT_AMBULATORY_CARE_PROVIDER_SITE_OTHER): Payer: Medicare Other | Admitting: Nurse Practitioner

## 2018-07-17 VITALS — BP 134/85 | HR 82 | Temp 98.5°F | Ht 63.0 in | Wt 162.0 lb

## 2018-07-17 DIAGNOSIS — Z0001 Encounter for general adult medical examination with abnormal findings: Secondary | ICD-10-CM | POA: Diagnosis not present

## 2018-07-17 DIAGNOSIS — E785 Hyperlipidemia, unspecified: Secondary | ICD-10-CM | POA: Diagnosis not present

## 2018-07-17 DIAGNOSIS — Z0000 Encounter for general adult medical examination without abnormal findings: Secondary | ICD-10-CM

## 2018-07-17 DIAGNOSIS — Z720 Tobacco use: Secondary | ICD-10-CM

## 2018-07-17 DIAGNOSIS — M810 Age-related osteoporosis without current pathological fracture: Secondary | ICD-10-CM | POA: Diagnosis not present

## 2018-07-17 DIAGNOSIS — Z6828 Body mass index (BMI) 28.0-28.9, adult: Secondary | ICD-10-CM

## 2018-07-17 DIAGNOSIS — I10 Essential (primary) hypertension: Secondary | ICD-10-CM

## 2018-07-17 LAB — URINALYSIS, COMPLETE
Bilirubin, UA: NEGATIVE
Glucose, UA: NEGATIVE
Ketones, UA: NEGATIVE
Nitrite, UA: NEGATIVE
Protein, UA: NEGATIVE
RBC, UA: NEGATIVE
Specific Gravity, UA: 1.02 (ref 1.005–1.030)
Urobilinogen, Ur: 0.2 mg/dL (ref 0.2–1.0)
pH, UA: 5.5 (ref 5.0–7.5)

## 2018-07-17 LAB — MICROSCOPIC EXAMINATION
RBC, UA: NONE SEEN /hpf (ref 0–2)
Renal Epithel, UA: NONE SEEN /hpf

## 2018-07-17 MED ORDER — LISINOPRIL-HYDROCHLOROTHIAZIDE 20-12.5 MG PO TABS: 1.0000 | ORAL_TABLET | Freq: Every day | ORAL | 0 refills | Status: DC

## 2018-07-17 MED ORDER — ROSUVASTATIN CALCIUM 10 MG PO TABS: 10.0000 mg | ORAL_TABLET | Freq: Every day | ORAL | 0 refills | Status: DC

## 2018-07-17 NOTE — Progress Notes (Signed)
Subjective:    Patient ID: Lauren Dunn, female    DOB: 1951/03/15, 68 y.o.   MRN: [id number]   Chief Complaint: Annual Exam   HPI:  1. Annual physical exam  Having pap today  2. Essential hypertension  No c/o chest pain, sob or headache. Does not check blood pressure at home. BP Readings from Last 3 Encounters:  07/17/18 134/85  02/18/18 113/71  10/03/17 134/84     3. Hyperlipidemia with target LDL less than 100  Does not watch diet and does very little exercise.  4. Age-related osteoporosis without current pathological fracture  last dexascan was Dunn 02/06/17. t score of -2.7. does no weight bearing exercises  5. BMI 28.0-28.9,adult  No recent weight changes    Outpatient Encounter Medications as of 07/17/2018  Medication Sig  . alendronate (FOSAMAX) 70 MG tablet Take 1 tablet (70 mg total) by mouth every 7 (seven) days. Take with a full glass of water on an empty stomach.  . Ascorbic Acid (VITAMIN C PO) Take by mouth.  Marland Kitchen BIOTIN PO Take by mouth.  Marland Kitchen CALCIUM PO Take by mouth.  . cetirizine (ZYRTEC ALLERGY) 10 MG tablet Take 10 mg by mouth daily.  . Cholecalciferol (VITAMIN D3 PO) Take by mouth.  Marland Kitchen lisinopril-hydrochlorothiazide (PRINZIDE,ZESTORETIC) 20-12.5 MG tablet Take 1 tablet by mouth daily. (Needs to be seen before next refill)  . Multiple Vitamin (MULTIVITAMIN) capsule Take 1 capsule by mouth daily.  . rosuvastatin (CRESTOR) 10 MG tablet Take 1 tablet (10 mg total) by mouth daily. (Needs to be seen before next refill)       New complaints: None today  Social history: Is retired from [employer]. Lives alone- has a son and 3 grandchildren.   Review of Systems  Constitutional: Negative for activity change and appetite change.  HENT: Negative.   Eyes: Negative for pain.  Respiratory: Negative for shortness of breath.   Cardiovascular: Negative for chest pain, palpitations and leg swelling.  Gastrointestinal: Negative for abdominal pain.  Endocrine: Negative for  polydipsia.  Genitourinary: Negative.   Skin: Negative for rash.  Neurological: Negative for dizziness, weakness and headaches.  Hematological: Does not bruise/bleed easily.  Psychiatric/Behavioral: Negative.   All other systems reviewed and are negative.      Objective:   Physical Exam Vitals signs and nursing note reviewed.  Constitutional:      General: She is not in acute distress.    Appearance: Normal appearance. She is well-developed.  HENT:     Head: Normocephalic.     Nose: Nose normal.  Eyes:     Pupils: Pupils are equal, round, and reactive to light.  Neck:     Musculoskeletal: Normal range of motion and neck supple.     Vascular: No carotid bruit or JVD.  Cardiovascular:     Rate and Rhythm: Normal rate and regular rhythm.     Heart sounds: Normal heart sounds.  Pulmonary:     Effort: Pulmonary effort is normal. No respiratory distress.     Breath sounds: Normal breath sounds. No wheezing or rales.  Chest:     Chest wall: No tenderness.  Abdominal:     General: Bowel sounds are normal. There is no distension or abdominal bruit.     Palpations: Abdomen is soft. There is no hepatomegaly, splenomegaly, mass or pulsatile mass.     Tenderness: There is no abdominal tenderness.  Genitourinary:    General: Normal vulva.     Vagina: No vaginal discharge.  Rectum: Normal.     Comments: Cervical os parous and pink No adnexal masses or tenderness Musculoskeletal: Normal range of motion.  Lymphadenopathy:     Cervical: No cervical adenopathy.  Skin:    General: Skin is warm and dry.  Neurological:     Mental Status: She is alert and oriented to person, place, and time.     Deep Tendon Reflexes: Reflexes are normal and symmetric.  Psychiatric:        Behavior: Behavior normal.        Thought Content: Thought content normal.        Judgment: Judgment normal.    BP 134/85   Pulse 82   Temp 98.5 F (36.9 C) (Oral)   Ht _0  (1.6 m)   Wt 162 lb (73.5 kg)    BMI 28.70 kg/m    Urine clear    Assessment & Plan:  Lauren Dunn comes in today with chief complaint of Annual Exam   Diagnosis and orders addressed:  1. Annual physical exam - Urinalysis, Complete - CBC with Differential/Platelet - Thyroid Panel With TSH - Pap IG (Image Guided)  2. Essential hypertension Low sodium diet - CMP14+EGFR  3. Hyperlipidemia with target LDL less than 100 **low fat diet*- Lipid panel  4. Age-related osteoporosis without current pathological fracture Weight bering exercises  5. BMI 28.0-28.9,adult Discussed diet and exercise for person with BMI >25 Will recheck weight in 3-6 months   Labs pending Health Maintenance reviewed Diet and exercise encouraged  Follow up plan: 6 months   Lauren Hassell Done, FNP

## 2018-07-17 NOTE — Patient Instructions (Signed)
Steps to Quit Smoking    Smoking tobacco can be bad for your health. It can also affect almost every organ in your body. Smoking puts you and people around you at risk for many serious long-lasting (chronic) diseases. Quitting smoking is hard, but it is one of the best things that you can do for your health. It is never too late to quit.  What are the benefits of quitting smoking?  When you quit smoking, you lower your risk for getting serious diseases and conditions. They can include:  · Lung cancer or lung disease.  · Heart disease.  · Stroke.  · Heart attack.  · Not being able to have children (infertility).  · Weak bones (osteoporosis) and broken bones (fractures).  If you have coughing, wheezing, and shortness of breath, those symptoms may get better when you quit. You may also get sick less often. If you are pregnant, quitting smoking can help to lower your chances of having a baby of low birth weight.  What can I do to help me quit smoking?  Talk with your doctor about what can help you quit smoking. Some things you can do (strategies) include:  · Quitting smoking totally, instead of slowly cutting back how much you smoke over a period of time.  · Going to in-person counseling. You are more likely to quit if you go to many counseling sessions.  · Using resources and support systems, such as:  ? Online chats with a counselor.  ? Phone quitlines.  ? Printed self-help materials.  ? Support groups or group counseling.  ? Text messaging programs.  ? Mobile phone apps or applications.  · Taking medicines. Some of these medicines may have nicotine in them. If you are pregnant or breastfeeding, do not take any medicines to quit smoking unless your doctor says it is okay. Talk with your doctor about counseling or other things that can help you.  Talk with your doctor about using more than one strategy at the same time, such as taking medicines while you are also going to in-person counseling. This can help make  quitting easier.  What things can I do to make it easier to quit?  Quitting smoking might feel very hard at first, but there is a lot that you can do to make it easier. Take these steps:  · Talk to your family and friends. Ask them to support and encourage you.  · Call phone quitlines, reach out to support groups, or work with a counselor.  · Ask people who smoke to not smoke around you.  · Avoid places that make you want (trigger) to smoke, such as:  ? Bars.  ? Parties.  ? Smoke-break areas at work.  · Spend time with people who do not smoke.  · Lower the stress in your life. Stress can make you want to smoke. Try these things to help your stress:  ? Getting regular exercise.  ? Deep-breathing exercises.  ? Yoga.  ? Meditating.  ? Doing a body scan. To do this, close your eyes, focus on one area of your body at a time from head to toe, and notice which parts of your body are tense. Try to relax the muscles in those areas.  · Download or buy apps on your mobile phone or tablet that can help you stick to your quit plan. There are many free apps, such as QuitGuide from the CDC (Centers for Disease Control and Prevention). You can find more   support from smokefree.gov and other websites.  This information is not intended to replace advice given to you by your health care provider. Make sure you discuss any questions you have with your health care provider.  Document Released: 03/09/2009 Document Revised: 01/09/2016 Document Reviewed: 09/27/2014  Elsevier Interactive Patient Education © 2019 Elsevier Inc.

## 2018-07-18 LAB — CMP14+EGFR
ALT: 20 IU/L (ref 0–32)
AST: 24 IU/L (ref 0–40)
Albumin/Globulin Ratio: 2.1 (ref 1.2–2.2)
Albumin: 4.7 g/dL (ref 3.8–4.8)
Alkaline Phosphatase: 51 IU/L (ref 39–117)
BUN/Creatinine Ratio: 19 (ref 12–28)
BUN: 17 mg/dL (ref 8–27)
Bilirubin Total: 0.3 mg/dL (ref 0.0–1.2)
CALCIUM: 10.3 mg/dL (ref 8.7–10.3)
CO2: 22 mmol/L (ref 20–29)
Chloride: 102 mmol/L (ref 96–106)
Creatinine, Ser: 0.88 mg/dL (ref 0.57–1.00)
GFR calc Af Amer: 78 mL/min/{1.73_m2} (ref >59)
GFR calc non Af Amer: 68 mL/min/{1.73_m2} (ref >59)
Globulin, Total: 2.2 g/dL (ref 1.5–4.5)
Glucose: 90 mg/dL (ref 65–99)
Potassium: 4.5 mmol/L (ref 3.5–5.2)
Sodium: 143 mmol/L (ref 134–144)
Total Protein: 6.9 g/dL (ref 6.0–8.5)

## 2018-07-18 LAB — LIPID PANEL
Chol/HDL Ratio: 2.8 ratio (ref 0.0–4.4)
Cholesterol, Total: 133 mg/dL (ref 100–199)
HDL: 47 mg/dL (ref >39)
LDL Calculated: 62 mg/dL (ref 0–99)
Triglycerides: 120 mg/dL (ref 0–149)
VLDL Cholesterol Cal: 24 mg/dL (ref 5–40)

## 2018-07-18 LAB — CBC WITH DIFFERENTIAL/PLATELET
Basophils Absolute: 0.1 10*3/uL (ref 0.0–0.2)
Basos: 1 %
EOS (ABSOLUTE): 0.1 10*3/uL (ref 0.0–0.4)
Eos: 1 %
Hematocrit: 38.5 % (ref 34.0–46.6)
Hemoglobin: 13.5 g/dL (ref 11.1–15.9)
Immature Grans (Abs): 0 10*3/uL (ref 0.0–0.1)
Immature Granulocytes: 0 %
LYMPHS ABS: 2.2 10*3/uL (ref 0.7–3.1)
Lymphs: 27 %
MCH: 31 pg (ref 26.6–33.0)
MCHC: 35.1 g/dL (ref 31.5–35.7)
MCV: 89 fL (ref 79–97)
Monocytes Absolute: 0.6 10*3/uL (ref 0.1–0.9)
Monocytes: 7 %
Neutrophils Absolute: 5.4 10*3/uL (ref 1.4–7.0)
Neutrophils: 64 %
Platelets: 270 10*3/uL (ref 150–450)
RBC: 4.35 x10E6/uL (ref 3.77–5.28)
RDW: 13 % (ref 11.7–15.4)
WBC: 8.3 10*3/uL (ref 3.4–10.8)

## 2018-07-18 LAB — THYROID PANEL WITH TSH
Free Thyroxine Index: 2.8 (ref 1.2–4.9)
T3 Uptake Ratio: 29 % (ref 24–39)
T4, Total: 9.6 ug/dL (ref 4.5–12.0)
TSH: 1.05 u[IU]/mL (ref 0.450–4.500)

## 2018-07-21 ENCOUNTER — Telehealth: Payer: Self-pay | Admitting: Nurse Practitioner

## 2018-07-21 LAB — PAP IG (IMAGE GUIDED)

## 2018-07-21 LAB — HM MAMMOGRAPHY

## 2018-07-23 ENCOUNTER — Telehealth: Payer: Self-pay | Admitting: Nurse Practitioner

## 2018-07-23 ENCOUNTER — Encounter: Payer: Self-pay | Admitting: *Deleted

## 2018-07-23 NOTE — Telephone Encounter (Signed)
Pt aware of all labs 

## 2018-08-03 ENCOUNTER — Encounter (HOSPITAL_COMMUNITY): Payer: Self-pay

## 2018-08-03 ENCOUNTER — Ambulatory Visit (HOSPITAL_COMMUNITY)
Admission: RE | Admit: 2018-08-03 | Discharge: 2018-08-03 | Disposition: A | Discharge status: Home / Self Care | Payer: Medicare Other | Source: Ambulatory Visit | Attending: Nurse Practitioner | Admitting: Nurse Practitioner

## 2018-08-03 DIAGNOSIS — Z720 Tobacco use: Secondary | ICD-10-CM

## 2018-08-14 ENCOUNTER — Other Ambulatory Visit: Payer: Self-pay | Admitting: *Deleted

## 2018-08-14 DIAGNOSIS — I10 Essential (primary) hypertension: Secondary | ICD-10-CM

## 2018-08-14 DIAGNOSIS — E785 Hyperlipidemia, unspecified: Secondary | ICD-10-CM

## 2018-08-14 MED ORDER — LISINOPRIL-HYDROCHLOROTHIAZIDE 20-12.5 MG PO TABS: 1.0000 | ORAL_TABLET | Freq: Every day | ORAL | 1 refills | Status: DC

## 2018-08-14 MED ORDER — ROSUVASTATIN CALCIUM 10 MG PO TABS: 10.0000 mg | ORAL_TABLET | Freq: Every day | ORAL | 1 refills | Status: DC

## 2018-08-14 NOTE — Addendum Note (Signed)
Addended by: Antonietta Barcelona D on: 08/14/2018 04:06 PM   Modules accepted: Orders

## 2018-09-02 ENCOUNTER — Ambulatory Visit: Payer: Medicare Other | Admitting: *Deleted

## 2018-09-25 ENCOUNTER — Other Ambulatory Visit: Payer: Self-pay

## 2018-09-25 ENCOUNTER — Ambulatory Visit (INDEPENDENT_AMBULATORY_CARE_PROVIDER_SITE_OTHER): Payer: Medicare Other

## 2018-09-25 VITALS — BP 134/85 | HR 82 | Temp 98.5°F | Ht 63.0 in | Wt 162.0 lb

## 2018-09-25 DIAGNOSIS — Z0000 Encounter for general adult medical examination without abnormal findings: Secondary | ICD-10-CM

## 2018-09-25 NOTE — Progress Notes (Addendum)
MEDICARE ANNUAL WELLNESS VISIT  09/25/2018  Telephone Visit Disclaimer This Medicare AWV was conducted by telephone due to national recommendations for restrictions regarding the COVID-19 Pandemic (e.g. social distancing).  I verified, using two identifiers, that I am speaking with Lauren Dunn or their authorized healthcare agent. I discussed the limitations, risks, security, and privacy concerns of performing an evaluation and management service by telephone and the potential availability of an in-person appointment in the future. The patient expressed understanding and agreed to proceed.   Subjective:  Lauren Dunn is a 68 y.o. female patient of Chevis Pretty, [place] who had a Medicare Annual Wellness Visit today via telephone. Lauren Dunn is Retired from [employer] and lives alone. she has 1 child. she reports that she is socially active and does interact with friends/family regularly. she is moderately physically active and enjoys working outside, Social worker, and reading.   Patient Care Team: Chevis Pretty, FNP as PCP - General (Nurse Practitioner)  Advanced Directives 09/25/2018 11/03/2016  Does Patient Have a Medical Advance Directive? No No  Would patient like information on creating a medical advance directive? Yes (ED - Information included in AVS) No - Patient declined   Patient requested information on Advanced Directives so I will mail that to patient today with her AVS  Hospital Utilization Over the Past 12 Months: # of hospitalizations or ER visits: 0 # of surgeries: 0  Review of Systems    Patient reports that her overall health is unchanged compared to last year.  Patient Reported Readings (BP, Pulse, CBG, Weight, etc) None  Review of Systems: Chart review and general  All other systems negative.  Pain Assessment Pain : No/denies pain     Current Medications & Allergies (verified) Allergies as of 09/25/2018      Reactions   Lipitor [atorvastatin]    myalgia   Sulfur    Inflammation to eyes      Medication List       Accurate as of Sep 25, 2018 11:20 AM. Always use your most recent med list.        alendronate 70 MG tablet Commonly known as:  Fosamax Take 1 tablet (70 mg total) by mouth every 7 (seven) days. Take with a full glass of water on an empty stomach.   BIOTIN PO Take by mouth.   CALCIUM PO Take by mouth.   lisinopril-hydrochlorothiazide 20-12.5 MG tablet Commonly known as:  ZESTORETIC Take 1 tablet by mouth daily.   multivitamin capsule Take 1 capsule by mouth daily.   rosuvastatin 10 MG tablet Commonly known as:  CRESTOR Take 1 tablet (10 mg total) by mouth daily.   VITAMIN C PO Take by mouth.   VITAMIN D3 PO Take by mouth.   ZyrTEC Allergy 10 MG tablet Generic drug:  cetirizine Take 10 mg by mouth daily.       History (reviewed): Past Medical History:  Diagnosis Date  . Allergy   . Hyperlipidemia   . Hypertension   . Personal history of colonic polyps 02/11/2012   Past Surgical History:  Procedure Laterality Date  . COLONOSCOPY  2013   Family History  Problem Relation Age of Onset  . Hypertension Sister   . Diabetes Brother   . Stroke Mother   . Hypertension Mother   . Hypertension Sister   . Colon cancer Neg Hx   . Esophageal cancer Neg Hx   . Stomach cancer Neg Hx   . Rectal cancer Neg  Hx   . Colon polyps Neg Hx    Social History   Socioeconomic History  . Marital status: Single    Spouse name: Not on file  . Number of children: 1  . Years of education: Not on file  . Highest education level: Associate degree: occupational, Hotel manager, or vocational program  Occupational History  . Occupation: Retired    Comment: 20 yrs at [employer]  . Financial resource strain: Not hard at all  . Food insecurity:    Worry: Never true    Inability: Never true  . Transportation needs:    Medical: No    Non-medical: No  Tobacco Use  . Smoking  status: Current Every Day Smoker    Packs/day: 1.00    Types: Cigarettes  . Smokeless tobacco: Never Used  Substance and Sexual Activity  . Alcohol use: No  . Drug use: No  . Sexual activity: Not Currently  Lifestyle  . Physical activity:    Days per week: 5 days    Minutes per session: 30 min  . Stress: Not on file  Relationships  . Social connections:    Talks on phone: More than three times a week    Gets together: More than three times a week    Attends religious service: Not on file    Active member of club or organization: No    Attends meetings of clubs or organizations: Never    Relationship status: Not on file  Other Topics Concern  . Not on file  Social History Narrative  . Not on file    Activities of Daily Living In your present state of health, do you have any difficulty performing the following activities: 09/25/2018  Hearing? N  Vision? Y  Comment Reading glasses  Difficulty concentrating or making decisions? N  Walking or climbing stairs? N  Dressing or bathing? N  Doing errands, shopping? N  Preparing Food and eating ? N  Using the Toilet? N  In the past six months, have you accidently leaked urine? N  Do you have problems with loss of bowel control? N  Managing your Medications? N  Managing your Finances? N  Housekeeping or managing your Housekeeping? N  Some recent data might be hidden    Patient Literacy How often do you need to have someone help you when you read instructions, pamphlets, or other written materials from your doctor or pharmacy?: 1 - Never What is the last grade level you completed in school?: 12th grade and has an associate degree  Exercise Current Exercise Habits: The patient does not participate in regular exercise at present, Exercise limited by: None identified  Diet Patient reports consuming 3 meals a day and 2 snack(s) a day Patient reports that her primary diet is: Regular Patient reports that she does have regular  access to food.   Depression Screen PHQ 2/9 Scores 07/17/2018 10/03/2017 02/06/2017 01/25/2016 01/23/2015 06/02/2014 12/10/2013  PHQ - 2 Score 0 0 0 0 0 0 0     Fall Risk Fall Risk  07/17/2018 10/03/2017 02/06/2017 01/25/2016 01/23/2015  Falls in the past year? 0 No No No No     Objective:  [name] seemed alert and oriented and she participated appropriately during our telephone visit.  Blood Pressure Weight BMI  BP Readings from Last 3 Encounters:  09/25/18 134/85  07/17/18 134/85  02/18/18 113/71   Wt Readings from Last 3 Encounters:  09/25/18 162 lb (73.5 kg)  07/17/18  162 lb (73.5 kg)  02/18/18 158 lb (71.7 kg)   BMI Readings from Last 1 Encounters:  09/25/18 28.70 kg/m    *Unable to obtain current vital signs, weight, and BMI due to telephone visit type  Hearing/Vision  . Shia did not seem to have difficulty with hearing/understanding during the telephone conversation . Reports that she has not had a formal eye exam by an eye care professional within the past year . Reports that she has not had a formal hearing evaluation within the past year *Unable to fully assess hearing and vision during telephone visit type  Cognitive Function: 6CIT Screen 09/25/2018  What Year? 0 points  What month? 0 points  What time? 0 points  Count back from 20 0 points  Months in reverse 0 points  Repeat phrase 0 points  Total Score 0    Normal Cognitive Function Screening: Yes (Normal:0-7, Significant for Dysfunction: >8)  Immunization & Health Maintenance Record Immunization History  Administered Date(s) Administered  . Pneumococcal Conjugate-13 01/24/2015  . Pneumococcal Polysaccharide-23 04/13/2010  . Zoster 01/25/2016    Health Maintenance  Topic Date Due  . TETANUS/TDAP  10/04/2018 (Originally 03/16/2016)  . INFLUENZA VACCINE  12/26/2018  . DEXA SCAN  02/07/2019  . MAMMOGRAM  07/21/2020  . COLONOSCOPY  02/18/2021  . Hepatitis C Screening  Completed  . PNA vac Low Risk  Adult  Completed       Assessment  This is a routine wellness examination for [employer].  Health Maintenance: Due or Overdue There are no preventive care reminders to display for this patient.  Lauren Dunn does not need a referral for Community Assistance: Care Management:   no Social Work:    no Prescription Assistance:  no Nutrition/Diabetes Education:  no   Plan:  Personalized Goals Goals Addressed            This Visit's Progress   . Exercise 150 min/wk Moderate Activity      . Have 3 meals a day        Personalized Health Maintenance & Screening Recommendations  Td vaccine  Lung Cancer Screening Recommended: no  Patient was referred for this a few months ago and once arrive for appt they decided she had not been a regular smoker for 30 years so exam was not performed.  (Low Dose CT Chest recommended if Age 57-80 years, 30 pack-year currently smoking OR have quit w/in past 15 years) Hepatitis C Screening recommended: no  Done on 07/26/15 HIV Screening recommended: no  Advanced Directives: Written information was prepared per patient's request. Information mailed to patient  Referrals & Orders No orders of the defined types were placed in this encounter.   Follow-up Plan . Follow-up with Chevis Pretty, FNP as planned . Schedule follow up per regular follow up schedule with MMM    I have personally reviewed and noted the following in the patient's chart:   . Medical and social history . Use of alcohol, tobacco or illicit drugs  . Current medications and supplements . Functional ability and status . Nutritional status . Physical activity . Advanced directives . List of other physicians . Hospitalizations, surgeries, and ER visits in previous 12 months . Vitals . Screenings to include cognitive, depression, and falls . Referrals and appointments  In addition, I have reviewed and discussed with Lauren Dunn certain preventive protocols, quality  metrics, and best practice recommendations. A written personalized care plan for preventive services as well as general preventive  health recommendations is available and can be mailed to the patient at her request.      Theodoro Clock LPN 10/02/4705  I have reviewed and agree with the above AWV documentation.   [name], FNP

## 2018-09-25 NOTE — Patient Instructions (Signed)
Advance Directive  Advance directives are legal documents that let you make choices ahead of time about your health care and medical treatment in case you become unable to communicate for yourself. Advance directives are a way for you to communicate your wishes to family, friends, and health care providers. This can help convey your decisions about end-of-life care if you become unable to communicate. Discussing and writing advance directives should happen over time rather than all at once. Advance directives can be changed depending on your situation and what you want, even after you have signed the advance directives. If you do not have an advance directive, some states assign family decision makers to act on your behalf based on how closely you are related to them. Each state has its own laws regarding advance directives. You may want to check with your health care provider, attorney, or state representative about the laws in your state. There are different types of advance directives, such as:  Medical power of attorney.  Living will.  Do not resuscitate (DNR) or do not attempt resuscitation (DNAR) order. Health care proxy and medical power of attorney A health care proxy, also called a health care agent, is a person who is appointed to make medical decisions for you in cases in which you are unable to make the decisions yourself. Generally, people choose someone they know well and trust to represent their preferences. Make sure to ask this person for an agreement to act as your proxy. A proxy may have to exercise judgment in the event of a medical decision for which your wishes are not known. A medical power of attorney is a legal document that names your health care proxy. Depending on the laws in your state, after the document is written, it may also need to be:  Signed.  Notarized.  Dated.  Copied.  Witnessed.  Incorporated into your medical record. You may also want to appoint  someone to manage your financial affairs in a situation in which you are unable to do so. This is called a durable power of attorney for finances. It is a separate legal document from the durable power of attorney for health care. You may choose the same person or someone different from your health care proxy to act as your agent in financial matters. If you do not appoint a proxy, or if there is a concern that the proxy is not acting in your best interests, a court-appointed guardian may be designated to act on your behalf. Living will A living will is a set of instructions documenting your wishes about medical care when you cannot express them yourself. Health care providers should keep a copy of your living will in your medical record. You may want to give a copy to family members or friends. To alert caregivers in case of an emergency, you can place a card in your wallet to let them know that you have a living will and where they can find it. A living will is used if you become:  Terminally ill.  Incapacitated.  Unable to communicate or make decisions. Items to consider in your living will include:  The use or non-use of life-sustaining equipment, such as dialysis machines and breathing machines (ventilators).  A DNR or DNAR order, which is the instruction not to use cardiopulmonary resuscitation (CPR) if breathing or heartbeat stops.  The use or non-use of tube feeding.  Withholding of food and fluids.  Comfort (palliative) care when the goal becomes comfort rather  than a cure.  Organ and tissue donation. A living will does not give instructions for distributing your money and property if you should pass away. It is recommended that you seek the advice of a lawyer when writing a will. Decisions about taxes, beneficiaries, and asset distribution will be legally binding. This process can relieve your family and friends of any concerns surrounding disputes or questions that may come up about  the distribution of your assets. DNR or DNAR A DNR or DNAR order is a request not to have CPR in the event that your heart stops beating or you stop breathing. If a DNR or DNAR order has not been made and shared, a health care provider will try to help any patient whose heart has stopped or who has stopped breathing. If you plan to have surgery, talk with your health care provider about how your DNR or DNAR order will be followed if problems occur. Summary  Advance directives are the legal documents that allow you to make choices ahead of time about your health care and medical treatment in case you become unable to communicate for yourself.  The process of discussing and writing advance directives should happen over time. You can change the advance directives, even after you have signed them.  Advance directives include DNR or DNAR orders, living wills, and designating an agent as your medical power of attorney. This information is not intended to replace advice given to you by your health care provider. Make sure you discuss any questions you have with your health care provider. Document Released: 08/20/2007 Document Revised: 04/01/2016 Document Reviewed: 04/01/2016 Elsevier Interactive Patient Education  2019 [place].  [name] , Thank you for taking time to come for your Medicare Wellness Visit. I appreciate your ongoing commitment to your health goals. Please review the following plan we discussed and let me know if I can assist you in the future.   These are the goals we discussed: Goals    . Exercise 150 min/wk Moderate Activity    . Have 3 meals a day       This is a list of the screening recommended for you and due dates:  Health Maintenance  Topic Date Due  . Tetanus Vaccine  10/04/2018*  . Flu Shot  12/26/2018  . DEXA scan (bone density measurement)  02/07/2019  . Mammogram  07/21/2020  . Colon Cancer Screening  02/18/2021  .  Hepatitis C: One time screening is  recommended by Center for Disease Control  (CDC) for  adults born from [birth year] through 1966.   Completed  . Pneumonia vaccines  Completed  *Topic was postponed. The date shown is not the original due date.

## 2018-10-01 ENCOUNTER — Ambulatory Visit: Payer: Medicare Other | Admitting: *Deleted

## 2019-01-14 ENCOUNTER — Other Ambulatory Visit: Payer: Self-pay

## 2019-01-15 ENCOUNTER — Encounter: Payer: Self-pay | Admitting: Nurse Practitioner

## 2019-01-15 ENCOUNTER — Ambulatory Visit: Payer: Medicare Other | Admitting: Nurse Practitioner

## 2019-01-15 ENCOUNTER — Other Ambulatory Visit: Payer: Self-pay

## 2019-01-15 VITALS — BP 136/75 | HR 72 | Temp 97.3°F | Ht 63.0 in | Wt 162.6 lb

## 2019-01-15 DIAGNOSIS — M810 Age-related osteoporosis without current pathological fracture: Secondary | ICD-10-CM

## 2019-01-15 DIAGNOSIS — E785 Hyperlipidemia, unspecified: Secondary | ICD-10-CM

## 2019-01-15 DIAGNOSIS — I10 Essential (primary) hypertension: Secondary | ICD-10-CM | POA: Diagnosis not present

## 2019-01-15 DIAGNOSIS — F17200 Nicotine dependence, unspecified, uncomplicated: Secondary | ICD-10-CM | POA: Diagnosis not present

## 2019-01-15 DIAGNOSIS — Z6828 Body mass index (BMI) 28.0-28.9, adult: Secondary | ICD-10-CM

## 2019-01-15 MED ORDER — ALENDRONATE SODIUM 70 MG PO TABS: 70.0000 mg | ORAL_TABLET | ORAL | 1 refills | Status: DC

## 2019-01-15 MED ORDER — LISINOPRIL-HYDROCHLOROTHIAZIDE 20-12.5 MG PO TABS: 1.0000 | ORAL_TABLET | Freq: Every day | ORAL | 1 refills | Status: DC

## 2019-01-15 MED ORDER — ROSUVASTATIN CALCIUM 10 MG PO TABS: 10.0000 mg | ORAL_TABLET | Freq: Every day | ORAL | 1 refills | Status: DC

## 2019-01-15 NOTE — Patient Instructions (Signed)

## 2019-01-15 NOTE — Progress Notes (Signed)
Subjective:    Patient ID: Lauren Dunn, female    DOB: 11-May-1951, 68 y.o.   MRN: [id number]   Chief Complaint: Medical Management of Chronic Issues    HPI:  1. Essential hypertension No c/o chest pain, sob or headache. Dos not check blood pressure at home. BP Readings from Last 3 Encounters:  09/25/18 134/85  07/17/18 134/85  02/18/18 113/71     2. Hyperlipidemia with target LDL less than 100 Does not watch diet and very seldom exercises. Is on crestor daily. Lab Results  Component Value Date   CHOL 133 07/17/2018   HDL 47 07/17/2018   LDLCALC 62 07/17/2018   TRIG 120 07/17/2018   CHOLHDL 2.8 07/17/2018     3. Smoker Still smokes over a pack a day. For over 30 years.  4. Age-related osteoporosis without current pathological fracture Last dexascan was done 01/2017. Takes daily vitamin d supplement and multivitamin. Does no weight bearing exercises.  5. BMI 28.0-28.9,adult No recent weight chnages    Outpatient Encounter Medications as of 01/15/2019  Medication Sig  . Ascorbic Acid (VITAMIN C PO) Take by mouth.  Marland Kitchen BIOTIN PO Take by mouth.  Marland Kitchen CALCIUM PO Take by mouth.  . cetirizine (ZYRTEC ALLERGY) 10 MG tablet Take 10 mg by mouth daily.  . Cholecalciferol (VITAMIN D3 PO) Take by mouth.  Marland Kitchen lisinopril-hydrochlorothiazide (PRINZIDE,ZESTORETIC) 20-12.5 MG tablet Take 1 tablet by mouth daily.  . Multiple Vitamin (MULTIVITAMIN) capsule Take 1 capsule by mouth daily.  . rosuvastatin (CRESTOR) 10 MG tablet Take 1 tablet (10 mg total) by mouth daily.     Past Surgical History:  Procedure Laterality Date  . COLONOSCOPY  2013    Family History  Problem Relation Age of Onset  . Hypertension Sister   . Diabetes Brother   . Stroke Mother   . Hypertension Mother   . Hypertension Sister   . Colon cancer Neg Hx   . Esophageal cancer Neg Hx   . Stomach cancer Neg Hx   . Rectal cancer Neg Hx   . Colon polyps Neg Hx     New complaints: None today  Social  history: lives by herself. Is retired form [employer]  Controlled substance contract: n/a    Review of Systems  Constitutional: Negative for activity change and appetite change.  HENT: Negative.   Eyes: Negative for pain.  Respiratory: Negative for shortness of breath.   Cardiovascular: Negative for chest pain, palpitations and leg swelling.  Gastrointestinal: Negative for abdominal pain.  Endocrine: Negative for polydipsia.  Genitourinary: Negative.   Skin: Negative for rash.  Neurological: Negative for dizziness, weakness and headaches.  Hematological: Does not bruise/bleed easily.  Psychiatric/Behavioral: Negative.   All other systems reviewed and are negative.      Objective:   Physical Exam Vitals signs and nursing note reviewed.  Constitutional:      General: She is not in acute distress.    Appearance: Normal appearance. She is well-developed.  HENT:     Head: Normocephalic.     Nose: Nose normal.  Eyes:     Pupils: Pupils are equal, round, and reactive to light.  Neck:     Musculoskeletal: Normal range of motion and neck supple.     Vascular: No carotid bruit or JVD.  Cardiovascular:     Rate and Rhythm: Normal rate and regular rhythm.     Heart sounds: Normal heart sounds.  Pulmonary:     Effort: Pulmonary effort is normal. No respiratory  distress.     Breath sounds: Normal breath sounds. No wheezing or rales.  Chest:     Chest wall: No tenderness.  Abdominal:     General: Bowel sounds are normal. There is no distension or abdominal bruit.     Palpations: Abdomen is soft. There is no hepatomegaly, splenomegaly, mass or pulsatile mass.     Tenderness: There is no abdominal tenderness.  Musculoskeletal: Normal range of motion.  Lymphadenopathy:     Cervical: No cervical adenopathy.  Skin:    General: Skin is warm and dry.  Neurological:     Mental Status: She is alert and oriented to person, place, and time.     Deep Tendon Reflexes: Reflexes are normal and  symmetric.  Psychiatric:        Behavior: Behavior normal.        Thought Content: Thought content normal.        Judgment: Judgment normal.    BP 136/75   Pulse 72   Temp (!) 97.3 F (36.3 C) (Temporal)   Ht _0  (1.6 m)   Wt 162 lb 9.6 oz (73.8 kg)   BMI 28.80 kg/m          Assessment & Plan:  Lauren Dunn comes in today with chief complaint of Medical Management of Chronic Issues and trouble sleeping   Diagnosis and orders addressed:  1. Essential hypertension Low sodium diet - CMP14+EGFR - EKG 12-Lead - lisinopril-hydrochlorothiazide (ZESTORETIC) 20-12.5 MG tablet; Take 1 tablet by mouth daily.  Dispense: 90 tablet; Refill: 1  2. Hyperlipidemia with target LDL less than 100 Low fat diet - Lipid panel - rosuvastatin (CRESTOR) 10 MG tablet; Take 1 tablet (10 mg total) by mouth daily.  Dispense: 90 tablet; Refill: 1  3. Smoker Will set up for low dose CT study at [facility]  4. Age-related osteoporosis without current pathological fracture Weight bearing exercises encouraged - alendronate (FOSAMAX) 70 MG tablet; Take 1 tablet (70 mg total) by mouth every 7 (seven) days. Take with a full glass of water on an empty stomach.  Dispense: 12 tablet; Refill: 1  5. BMI 28.0-28.9,adult Discussed diet and exercise for person with BMI >25 Will recheck weight in 3-6 months   Labs pending Health Maintenance reviewed Diet and exercise encouraged  Follow up plan: 6 months   [name], FNP

## 2019-01-16 LAB — CMP14+EGFR
ALT: 18 IU/L (ref 0–32)
AST: 27 IU/L (ref 0–40)
Albumin/Globulin Ratio: 2 (ref 1.2–2.2)
Albumin: 4.5 g/dL (ref 3.8–4.8)
Alkaline Phosphatase: 49 IU/L (ref 39–117)
BUN/Creatinine Ratio: 15 (ref 12–28)
BUN: 12 mg/dL (ref 8–27)
Bilirubin Total: 0.3 mg/dL (ref 0.0–1.2)
CO2: 25 mmol/L (ref 20–29)
Calcium: 9.9 mg/dL (ref 8.7–10.3)
Chloride: 101 mmol/L (ref 96–106)
Creatinine, Ser: 0.79 mg/dL (ref 0.57–1.00)
GFR calc Af Amer: 88 mL/min/{1.73_m2} (ref >59)
GFR calc non Af Amer: 77 mL/min/{1.73_m2} (ref >59)
Globulin, Total: 2.3 g/dL (ref 1.5–4.5)
Glucose: 79 mg/dL (ref 65–99)
Potassium: 4.4 mmol/L (ref 3.5–5.2)
Sodium: 141 mmol/L (ref 134–144)
Total Protein: 6.8 g/dL (ref 6.0–8.5)

## 2019-01-16 LAB — LIPID PANEL
Chol/HDL Ratio: 3.8 ratio (ref 0.0–4.4)
Cholesterol, Total: 148 mg/dL (ref 100–199)
HDL: 39 mg/dL — ABNORMAL LOW (ref >39)
LDL Calculated: 65 mg/dL (ref 0–99)
Triglycerides: 222 mg/dL — ABNORMAL HIGH (ref 0–149)
VLDL Cholesterol Cal: 44 mg/dL — ABNORMAL HIGH (ref 5–40)

## 2019-01-18 ENCOUNTER — Telehealth (HOSPITAL_COMMUNITY): Payer: Self-pay | Admitting: *Deleted

## 2019-01-18 ENCOUNTER — Encounter (HOSPITAL_COMMUNITY): Payer: Self-pay | Admitting: *Deleted

## 2019-01-18 DIAGNOSIS — Z122 Encounter for screening for malignant neoplasm of respiratory organs: Secondary | ICD-10-CM

## 2019-01-18 DIAGNOSIS — Z720 Tobacco use: Secondary | ICD-10-CM

## 2019-01-18 DIAGNOSIS — F17200 Nicotine dependence, unspecified, uncomplicated: Secondary | ICD-10-CM

## 2019-01-18 NOTE — Telephone Encounter (Signed)
Received referral for initial lung cancer screening scan.  Contacted patient and obtained smoking history (started age 68, current smoker, 30 pack year) as well as answering questions related to the screening process.  Patient denies signs/symptoms of lung cancer such as weight loss or hemoptysis.  Patient denies comorbidity that would prevent curative treatment if lung cancer were to be found.  Patient is scheduled for shared decision making visit and CT scan on 01/26/2019 at 9:50 am.

## 2019-01-19 ENCOUNTER — Telehealth: Payer: Self-pay | Admitting: Nurse Practitioner

## 2019-01-19 NOTE — Telephone Encounter (Signed)
Pt aware.

## 2019-01-25 NOTE — Patient Instructions (Addendum)
You were seen today for your shared decision making visit and a low-dose CT scan for lung cancer screening.    

## 2019-01-26 ENCOUNTER — Other Ambulatory Visit: Payer: Self-pay

## 2019-01-26 ENCOUNTER — Inpatient Hospital Stay (HOSPITAL_COMMUNITY): Payer: Medicare Other | Attending: Hematology | Admitting: Hematology

## 2019-01-26 ENCOUNTER — Ambulatory Visit (HOSPITAL_COMMUNITY)
Admission: RE | Admit: 2019-01-26 | Discharge: 2019-01-26 | Disposition: A | Discharge status: Home / Self Care | Payer: Medicare Other | Source: Ambulatory Visit | Attending: Hematology | Admitting: Hematology

## 2019-01-26 DIAGNOSIS — F17210 Nicotine dependence, cigarettes, uncomplicated: Secondary | ICD-10-CM | POA: Insufficient documentation

## 2019-01-26 DIAGNOSIS — J439 Emphysema, unspecified: Secondary | ICD-10-CM | POA: Insufficient documentation

## 2019-01-26 DIAGNOSIS — I700 Atherosclerosis of aorta: Secondary | ICD-10-CM | POA: Insufficient documentation

## 2019-01-26 DIAGNOSIS — Z122 Encounter for screening for malignant neoplasm of respiratory organs: Secondary | ICD-10-CM | POA: Diagnosis not present

## 2019-01-26 DIAGNOSIS — Z720 Tobacco use: Secondary | ICD-10-CM | POA: Diagnosis not present

## 2019-01-26 DIAGNOSIS — F17200 Nicotine dependence, unspecified, uncomplicated: Secondary | ICD-10-CM

## 2019-01-26 NOTE — Progress Notes (Signed)
Please see additional progress note on 01/26/2019.

## 2019-01-26 NOTE — Assessment & Plan Note (Signed)
1. Tobacco abuse - This patient meets the criteria for low dose CT lung cancer screening. She is asymptomatic for any signs or symptoms of lung cancer. - The Shared Decision-Making Visit discussion included risks and benefits of screening, potential for follow up diagnostic testing for abnormal scans, potential for false positive tests, over diagnosis, and discussion about total radiation exposure. - Patient stated willingness to undergo diagnostics and treatment as needed. - She was counseled on smoking cessation to decrease her risk of lung cancer, pulmonary disease, heart disease and stroke. - She was given a resource card with information on receiving free nicotine replacement therapy , and information about free smoking cessation classes. - She will present for her LDCT scan today and follow up with her PCP.

## 2019-01-26 NOTE — Progress Notes (Signed)
[facility] Initial Visit:  Patient Care Team: Chevis Pretty, FNP as PCP - General (Nurse Practitioner)  CHIEF COMPLAINTS/PURPOSE OF CONSULTATION: - Shared Decision-Making Visit for Lung Cancer Screening  HISTORY OF PRESENTING ILLNESS: Lauren Dunn 68 y.o. female presents today for a shared decision making visit for lung cancer screening.  She was referred by her PCP for a history of tobacco abuse.  Patient states she started smoking approximately at age 37.  She smokes 1 pack of cigarettes a day for the last 40 years.  She denies any hemoptysis.  Denies any weight loss.  She reports a dry chronic cough.  She reports moderate fatigue.  She has no prior history of lung cancer.  She denies any shortness of breath, lightheadedness or dizziness.  Denies any change in bowel habits.  Appetite is stable. Denies a family history of cancer.  Review of Systems  Constitutional: Positive for fatigue.  HENT:  Negative.   Eyes: Negative.   Respiratory: Positive for cough.   Cardiovascular: Negative.   Gastrointestinal: Negative.   Endocrine: Negative.   Genitourinary: Negative.    Musculoskeletal: Negative.   Skin: Negative.   Neurological: Negative.   Hematological: Negative.   Psychiatric/Behavioral: Negative.     MEDICAL HISTORY: Past Medical History:  Diagnosis Date  . Allergy   . Hyperlipidemia   . Hypertension   . Personal history of colonic polyps 02/11/2012    SURGICAL HISTORY: Past Surgical History:  Procedure Laterality Date  . COLONOSCOPY  2013    SOCIAL HISTORY: Social History   Socioeconomic History  . Marital status: Single    Spouse name: Not on file  . Number of children: 1  . Years of education: Not on file  . Highest education level: Associate degree: occupational, Hotel manager, or vocational program  Occupational History  . Occupation: Retired    Comment: 20 yrs at [employer]  . Financial resource strain: Not hard at all  .  Food insecurity    Worry: Never true    Inability: Never true  . Transportation needs    Medical: No    Non-medical: No  Tobacco Use  . Smoking status: Current Every Day Smoker    Packs/day: 1.00    Types: Cigarettes  . Smokeless tobacco: Never Used  Substance and Sexual Activity  . Alcohol use: No  . Drug use: No  . Sexual activity: Not Currently  Lifestyle  . Physical activity    Days per week: 5 days    Minutes per session: 30 min  . Stress: Not on file  Relationships  . Social connections    Talks on phone: More than three times a week    Gets together: More than three times a week    Attends religious service: Not on file    Active member of club or organization: No    Attends meetings of clubs or organizations: Never    Relationship status: Not on file  . Intimate partner violence    Fear of current or ex partner: No    Emotionally abused: No    Physically abused: No    Forced sexual activity: No  Other Topics Concern  . Not on file  Social History Narrative  . Not on file    FAMILY HISTORY Family History  Problem Relation Age of Onset  . Hypertension Sister   . Diabetes Brother   . Stroke Mother   . Hypertension Mother   . Hypertension Sister   .  Colon cancer Neg Hx   . Esophageal cancer Neg Hx   . Stomach cancer Neg Hx   . Rectal cancer Neg Hx   . Colon polyps Neg Hx     ALLERGIES:  is allergic to lipitor [atorvastatin] and sulfur.  MEDICATIONS:  Current Outpatient Medications  Medication Sig Dispense Refill  . alendronate (FOSAMAX) 70 MG tablet Take 1 tablet (70 mg total) by mouth every 7 (seven) days. Take with a full glass of water on an empty stomach. 12 tablet 1  . Ascorbic Acid (VITAMIN C PO) Take by mouth.    Marland Kitchen BIOTIN PO Take by mouth.    Marland Kitchen CALCIUM PO Take by mouth.    . cetirizine (ZYRTEC ALLERGY) 10 MG tablet Take 10 mg by mouth daily.    . Cholecalciferol (VITAMIN D3 PO) Take by mouth.    Marland Kitchen lisinopril-hydrochlorothiazide (ZESTORETIC)  20-12.5 MG tablet Take 1 tablet by mouth daily. 90 tablet 1  . Multiple Vitamin (MULTIVITAMIN) capsule Take 1 capsule by mouth daily.    . rosuvastatin (CRESTOR) 10 MG tablet Take 1 tablet (10 mg total) by mouth daily. 90 tablet 1   No current facility-administered medications for this visit.     PHYSICAL EXAMINATION:  ECOG PERFORMANCE STATUS: 1 - Symptomatic but completely ambulatory   There were no vitals filed for this visit.  There were no vitals filed for this visit.   Physical Exam Constitutional:      Appearance: Normal appearance. She is obese.  HENT:     Head: Normocephalic.     Right Ear: External ear normal.     Left Ear: External ear normal.     Nose: Nose normal.     Mouth/Throat:     Mouth: Mucous membranes are moist.     Pharynx: Oropharynx is clear.  Eyes:     Extraocular Movements: Extraocular movements intact.     Conjunctiva/sclera: Conjunctivae normal.  Neck:     Musculoskeletal: Normal range of motion.  Cardiovascular:     Rate and Rhythm: Normal rate and regular rhythm.     Pulses: Normal pulses.     Heart sounds: Normal heart sounds.  Pulmonary:     Effort: Pulmonary effort is normal.     Comments: Diminished breath sounds all lobes   Abdominal:     General: Bowel sounds are normal.     Palpations: Abdomen is soft.  Musculoskeletal: Normal range of motion.  Skin:    General: Skin is warm.  Neurological:     General: No focal deficit present.     Mental Status: She is alert and oriented to person, place, and time.  Psychiatric:        Mood and Affect: Mood normal.        Behavior: Behavior normal.        Thought Content: Thought content normal.        Judgment: Judgment normal.      LABORATORY DATA: I have personally reviewed the data as listed:  Office Visit on 01/15/2019  Component Date Value Ref Range Status  . Glucose 01/15/2019 79  65 - 99 mg/dL Final  . BUN 01/15/2019 12  8 - 27 mg/dL Final  . Creatinine, Ser 01/15/2019  0.79  0.57 - 1.00 mg/dL Final  . GFR calc non Af Amer 01/15/2019 77  >59 mL/min/1.73 Final  . GFR calc Af Amer 01/15/2019 88  >59 mL/min/1.73 Final  . BUN/Creatinine Ratio 01/15/2019 15  12 - 28 Final  . Sodium  01/15/2019 141  134 - 144 mmol/L Final  . Potassium 01/15/2019 4.4  3.5 - 5.2 mmol/L Final  . Chloride 01/15/2019 101  96 - 106 mmol/L Final  . CO2 01/15/2019 25  20 - 29 mmol/L Final  . Calcium 01/15/2019 9.9  8.7 - 10.3 mg/dL Final  . Total Protein 01/15/2019 6.8  6.0 - 8.5 g/dL Final  . Albumin 01/15/2019 4.5  3.8 - 4.8 g/dL Final  . Globulin, Total 01/15/2019 2.3  1.5 - 4.5 g/dL Final  . Albumin/Globulin Ratio 01/15/2019 2.0  1.2 - 2.2 Final  . Bilirubin Total 01/15/2019 0.3  0.0 - 1.2 mg/dL Final  . Alkaline Phosphatase 01/15/2019 49  39 - 117 IU/L Final  . AST 01/15/2019 27  0 - 40 IU/L Final  . ALT 01/15/2019 18  0 - 32 IU/L Final  . Cholesterol, Total 01/15/2019 148  100 - 199 mg/dL Final  . Triglycerides 01/15/2019 222* 0 - 149 mg/dL Final  . HDL 01/15/2019 39* >39 mg/dL Final  . VLDL Cholesterol Cal 01/15/2019 44* 5 - 40 mg/dL Final  . LDL Calculated 01/15/2019 65  0 - 99 mg/dL Final  . Chol/HDL Ratio 01/15/2019 3.8  0.0 - 4.4 ratio Final   Comment:                                   T. Chol/HDL Ratio                                             Men  Women                               1/2 Avg.Risk  3.4    3.3                                   Avg.Risk  5.0    4.4                                2X Avg.Risk  9.6    7.1                                3X Avg.Risk 23.4   11.0      ASSESSMENT/PLAN  Tobacco abuse 1. Tobacco abuse - This patient meets the criteria for low dose CT lung cancer screening. She is asymptomatic for any signs or symptoms of lung cancer. - The Shared Decision-Making Visit discussion included risks and benefits of screening, potential for follow up diagnostic testing for abnormal scans, potential for false positive tests, over diagnosis, and  discussion about total radiation exposure. - Patient stated willingness to undergo diagnostics and treatment as needed. - She was counseled on smoking cessation to decrease her risk of lung cancer, pulmonary disease, heart disease and stroke. - She was given a resource card with information on receiving free nicotine replacement therapy , and information about free smoking cessation classes. - She will present for her LDCT scan today and follow up with her PCP.      Roger Shelter,  FNP  01/26/2019 3:23 PM

## 2019-01-28 ENCOUNTER — Encounter (HOSPITAL_COMMUNITY): Payer: Self-pay | Admitting: *Deleted

## 2019-01-28 ENCOUNTER — Telehealth (HOSPITAL_COMMUNITY): Payer: Self-pay | Admitting: *Deleted

## 2019-01-28 NOTE — Progress Notes (Signed)
Patient mailed a copy of her LDCT results.  Copy of results were also sent to referring provider for follow up of incidental findings noted below.  Patient will be scheduled in 12 months for recommended follow up LDCT.    Impression:  1. Lung-RADS 2, benign appearance or behavior. Continue annual screening with low-dose chest CT without contrast in 12 months. 2.  Emphysema. (ICD10-J43.9) 3.  Aortic Atherosclerois (ICD10-170.0)

## 2019-02-17 ENCOUNTER — Encounter (HOSPITAL_COMMUNITY): Payer: Self-pay | Admitting: *Deleted

## 2019-05-03 ENCOUNTER — Ambulatory Visit (INDEPENDENT_AMBULATORY_CARE_PROVIDER_SITE_OTHER): Payer: Medicare Other | Admitting: Nurse Practitioner

## 2019-05-03 ENCOUNTER — Other Ambulatory Visit: Payer: Self-pay

## 2019-05-03 ENCOUNTER — Encounter: Payer: Self-pay | Admitting: Nurse Practitioner

## 2019-05-03 DIAGNOSIS — M545 Low back pain, unspecified: Secondary | ICD-10-CM

## 2019-05-03 MED ORDER — CYCLOBENZAPRINE HCL 10 MG PO TABS: 10.0000 mg | ORAL_TABLET | Freq: Three times a day (TID) | ORAL | 1 refills | Status: DC | PRN

## 2019-05-03 MED ORDER — PREDNISONE 20 MG PO TABS: ORAL_TABLET | ORAL | 0 refills | Status: DC

## 2019-05-03 NOTE — Progress Notes (Signed)
   Virtual Visit via telephone Note Due to COVID-19 pandemic this visit was conducted virtually. This visit type was conducted due to national recommendations for restrictions regarding the COVID-19 Pandemic (e.g. social distancing, sheltering in place) in an effort to limit this patient's exposure and mitigate transmission in our community. All issues noted in this document were discussed and addressed.  A physical exam was not performed with this format.  I connected with Lauren Dunn on 05/03/19 at 11:20 by telephone and verified that I am speaking with the correct person using two identifiers. Lauren Dunn is currently located at home and no one is currently with  her during visit. The provider, [name], FNP is located in their office at time of visit.  I discussed the limitations, risks, security and privacy concerns of performing an evaluation and management service by telephone and the availability of in person appointments. I also discussed with the patient that there may be a patient responsible charge related to this service. The patient expressed understanding and agreed to proceed.   History and Present Illness:   Chief Complaint: Back Pain   HPI Patient calls in c/o low back pain. She said she was walking dog and strained her lower back.occured over a week ago and is getting no better. Rates pain 6/10. Pain increases with activity. Rest helps.   Review of Systems  Constitutional: Negative for diaphoresis and weight loss.  Eyes: Negative for blurred vision, double vision and pain.  Respiratory: Negative for shortness of breath.   Cardiovascular: Negative for chest pain, palpitations, orthopnea and leg swelling.  Gastrointestinal: Negative for abdominal pain.  Musculoskeletal: Positive for back pain.  Skin: Negative for rash.  Neurological: Negative for dizziness, sensory change, loss of consciousness, weakness and headaches.  Endo/Heme/Allergies: Negative for  polydipsia. Does not bruise/bleed easily.  Psychiatric/Behavioral: Negative for memory loss. The patient does not have insomnia.   All other systems reviewed and are negative.    Observations/Objective: Alert and oriented- answers all questions appropriately No distress    Assessment and Plan: Lauren Dunn in today with chief complaint of Back Pain   1. Acute midline low back pain without sciatica Moist heat rest - predniSONE (DELTASONE) 20 MG tablet; 2 po at sametime daily for 5 days  Dispense: 10 tablet; Refill: 0 - cyclobenzaprine (FLEXERIL) 10 MG tablet; Take 1 tablet (10 mg total) by mouth 3 (three) times daily as needed for muscle spasms.  Dispense: 30 tablet; Refill: 1    Follow Up Instructions: prn    I discussed the assessment and treatment plan with the patient. The patient was provided an opportunity to ask questions and all were answered. The patient agreed with the plan and demonstrated an understanding of the instructions.   The patient was advised to call back or seek an in-person evaluation if the symptoms worsen or if the condition fails to improve as anticipated.  The above assessment and management plan was discussed with the patient. The patient verbalized understanding of and has agreed to the management plan. Patient is aware to call the clinic if symptoms persist or worsen. Patient is aware when to return to the clinic for a follow-up visit. Patient educated on when it is appropriate to go to the emergency department.   Time call ended:  11:34  I provided 14 minutes of non-face-to-face time during this encounter.    [name], FNP

## 2019-05-24 ENCOUNTER — Other Ambulatory Visit: Payer: Self-pay

## 2019-05-24 ENCOUNTER — Ambulatory Visit: Payer: Medicare Other | Attending: Internal Medicine

## 2019-05-24 DIAGNOSIS — Z20822 Contact with and (suspected) exposure to covid-19: Secondary | ICD-10-CM

## 2019-05-26 LAB — NOVEL CORONAVIRUS, NAA: SARS-CoV-2, NAA: NOT DETECTED

## 2019-07-16 ENCOUNTER — Other Ambulatory Visit: Payer: Self-pay

## 2019-07-19 ENCOUNTER — Other Ambulatory Visit: Payer: Self-pay

## 2019-07-19 ENCOUNTER — Encounter: Payer: Self-pay | Admitting: Nurse Practitioner

## 2019-07-19 ENCOUNTER — Other Ambulatory Visit (INDEPENDENT_AMBULATORY_CARE_PROVIDER_SITE_OTHER): Payer: Medicare PPO

## 2019-07-19 ENCOUNTER — Ambulatory Visit: Payer: Medicare PPO | Admitting: Nurse Practitioner

## 2019-07-19 VITALS — BP 124/74 | HR 80 | Temp 97.1°F | Resp 20 | Ht 63.0 in | Wt 163.0 lb

## 2019-07-19 DIAGNOSIS — M810 Age-related osteoporosis without current pathological fracture: Secondary | ICD-10-CM | POA: Diagnosis not present

## 2019-07-19 DIAGNOSIS — M8589 Other specified disorders of bone density and structure, multiple sites: Secondary | ICD-10-CM | POA: Diagnosis not present

## 2019-07-19 DIAGNOSIS — I10 Essential (primary) hypertension: Secondary | ICD-10-CM

## 2019-07-19 DIAGNOSIS — Z6828 Body mass index (BMI) 28.0-28.9, adult: Secondary | ICD-10-CM | POA: Diagnosis not present

## 2019-07-19 DIAGNOSIS — E785 Hyperlipidemia, unspecified: Secondary | ICD-10-CM

## 2019-07-19 DIAGNOSIS — Z720 Tobacco use: Secondary | ICD-10-CM

## 2019-07-19 MED ORDER — ROSUVASTATIN CALCIUM 10 MG PO TABS: 10.0000 mg | ORAL_TABLET | Freq: Every day | ORAL | 1 refills | Status: DC

## 2019-07-19 MED ORDER — LISINOPRIL-HYDROCHLOROTHIAZIDE 20-12.5 MG PO TABS: 1.0000 | ORAL_TABLET | Freq: Every day | ORAL | 1 refills | Status: DC

## 2019-07-19 NOTE — Progress Notes (Signed)
Subjective:    Patient ID: Lauren Dunn, female    DOB: August 23, 1950, 69 y.o.   MRN: [id number]   Chief Complaint: Medical Management of Chronic Issues    HPI:  1. Essential hypertension No c/o chest pain, sob or headache. Does not check blood pressure at home. BP Readings from Last 3 Encounters:  07/19/19 124/74  01/15/19 136/75  09/25/18 134/85     2. Hyperlipidemia with target LDL less than 100 Does not watch diet and does very little exercise. Lab Results  Component Value Date   CHOL 148 01/15/2019   HDL 39 (L) 01/15/2019   LDLCALC 65 01/15/2019   TRIG 222 (H) 01/15/2019   CHOLHDL 3.8 01/15/2019     3. Tobacco abuse Is still smoking about 1 pack a day  4. Age-related osteoporosis without current pathological fracture Is having dexascan today . Is on fosamax weekly, without side effects. Lauren Dunn says Lauren Dunn has bene on fosamax for over 8 years.  5. BMI 28.0-28.9,adult No recent weight changes Wt Readings from Last 3 Encounters:  07/19/19 163 lb (73.9 kg)  01/15/19 162 lb 9.6 oz (73.8 kg)  09/25/18 162 lb (73.5 kg)   BMI Readings from Last 3 Encounters:  07/19/19 28.87 kg/m  01/15/19 28.80 kg/m  09/25/18 28.70 kg/m       Outpatient Encounter Medications as of 07/19/2019  Medication Sig  . alendronate (FOSAMAX) 70 MG tablet Take 1 tablet (70 mg total) by mouth every 7 (seven) days. Take with a full glass of water on an empty stomach.  . Ascorbic Acid (VITAMIN C PO) Take by mouth.  Marland Kitchen BIOTIN PO Take by mouth.  Marland Kitchen CALCIUM PO Take by mouth.  . cetirizine (ZYRTEC ALLERGY) 10 MG tablet Take 10 mg by mouth daily.  . Cholecalciferol (VITAMIN D3 PO) Take by mouth.  Marland Kitchen lisinopril-hydrochlorothiazide (ZESTORETIC) 20-12.5 MG tablet Take 1 tablet by mouth daily.  . Multiple Vitamin (MULTIVITAMIN) capsule Take 1 capsule by mouth daily.  . rosuvastatin (CRESTOR) 10 MG tablet Take 1 tablet (10 mg total) by mouth daily.  . cyclobenzaprine (FLEXERIL) 10 MG tablet Take 1 tablet  (10 mg total) by mouth 3 (three) times daily as needed for muscle spasms. (Patient not taking: Reported on 07/19/2019)    Past Surgical History:  Procedure Laterality Date  . COLONOSCOPY  2013    Family History  Problem Relation Age of Onset  . Hypertension Sister   . Diabetes Brother   . Stroke Mother   . Hypertension Mother   . Hypertension Sister   . Colon cancer Neg Hx   . Esophageal cancer Neg Hx   . Stomach cancer Neg Hx   . Rectal cancer Neg Hx   . Colon polyps Neg Hx     New complaints: None today  Social history: Lives by [employer] Is retired from [employer]  Controlled substance contract: n/a    Review of Systems  Constitutional: Negative for diaphoresis.  Eyes: Negative for pain.  Respiratory: Negative for shortness of breath.   Cardiovascular: Negative for chest pain, palpitations and leg swelling.  Gastrointestinal: Negative for abdominal pain.  Endocrine: Negative for polydipsia.  Skin: Negative for rash.  Neurological: Negative for dizziness, weakness and headaches.  Hematological: Does not bruise/bleed easily.  All other systems reviewed and are negative.      Objective:   Physical Exam Vitals and nursing note reviewed.  Constitutional:      General: Lauren Dunn is not in acute distress.    Appearance: Normal  appearance. Lauren Dunn is well-developed.  HENT:     Head: Normocephalic.     Nose: Nose normal.  Eyes:     Pupils: Pupils are equal, round, and reactive to light.  Neck:     Vascular: No carotid bruit or JVD.  Cardiovascular:     Rate and Rhythm: Normal rate and regular rhythm.     Heart sounds: Normal heart sounds.  Pulmonary:     Effort: Pulmonary effort is normal. No respiratory distress.     Breath sounds: Normal breath sounds. No wheezing or rales.  Chest:     Chest wall: No tenderness.  Abdominal:     General: Bowel sounds are normal. There is no distension or abdominal bruit.     Palpations: Abdomen is soft. There is no hepatomegaly,  splenomegaly, mass or pulsatile mass.     Tenderness: There is no abdominal tenderness.  Musculoskeletal:        General: Normal range of motion.     Cervical back: Normal range of motion and neck supple.  Lymphadenopathy:     Cervical: No cervical adenopathy.  Skin:    General: Skin is warm and dry.  Neurological:     Mental Status: Lauren Dunn is alert and oriented to person, place, and time.     Deep Tendon Reflexes: Reflexes are normal and symmetric.  Psychiatric:        Behavior: Behavior normal.        Thought Content: Thought content normal.        Judgment: Judgment normal.    BP 124/74   Pulse 80   Temp (!) 97.1 F (36.2 C) (Temporal)   Resp 20   Ht '5\' 3"'  (1.6 m)   Wt 163 lb (73.9 kg)   SpO2 98%   BMI 28.87 kg/m         Assessment & Plan:  Lauren Dunn comes in today with chief complaint of Medical Management of Chronic Issues   Diagnosis and orders addressed:  1. Essential hypertension Low sodium diet - lisinopril-hydrochlorothiazide (ZESTORETIC) 20-12.5 MG tablet; Take 1 tablet by mouth daily.  Dispense: 90 tablet; Refill: 1 - CMP14+EGFR - CBC with Differential/Platelet  2. Hyperlipidemia with target LDL less than 100 Low fat diet - rosuvastatin (CRESTOR) 10 MG tablet; Take 1 tablet (10 mg total) by mouth daily.  Dispense: 90 tablet; Refill: 1 - Lipid panel  3. Tobacco abuse Smoking cessation encouraged  4. Age-related osteoporosis without current pathological fracture Weight bearing exercsies Will call wth bone density result - DG [facility] DEXA  5. BMI 28.0-28.9,adult Discussed diet and exercise for person with BMI >25 Will recheck weight in 3-6 months    Labs pending Health Maintenance reviewed Diet and exercise encouraged  Follow up plan: 6 months   [name], FNP

## 2019-07-19 NOTE — Patient Instructions (Signed)

## 2019-07-20 LAB — CBC WITH DIFFERENTIAL/PLATELET
Basophils Absolute: 0.1 10*3/uL (ref 0.0–0.2)
Basos: 1 %
EOS (ABSOLUTE): 0.1 10*3/uL (ref 0.0–0.4)
Eos: 1 %
Hematocrit: 40 % (ref 34.0–46.6)
Hemoglobin: 13.5 g/dL (ref 11.1–15.9)
Immature Grans (Abs): 0 10*3/uL (ref 0.0–0.1)
Immature Granulocytes: 0 %
Lymphocytes Absolute: 2.3 10*3/uL (ref 0.7–3.1)
Lymphs: 32 %
MCH: 30.7 pg (ref 26.6–33.0)
MCHC: 33.8 g/dL (ref 31.5–35.7)
MCV: 91 fL (ref 79–97)
Monocytes Absolute: 0.5 10*3/uL (ref 0.1–0.9)
Monocytes: 7 %
Neutrophils Absolute: 4.2 10*3/uL (ref 1.4–7.0)
Neutrophils: 59 %
Platelets: 292 10*3/uL (ref 150–450)
RBC: 4.4 x10E6/uL (ref 3.77–5.28)
RDW: 12.8 % (ref 11.7–15.4)
WBC: 7.2 10*3/uL (ref 3.4–10.8)

## 2019-07-20 LAB — CMP14+EGFR
ALT: 20 IU/L (ref 0–32)
AST: 26 IU/L (ref 0–40)
Albumin/Globulin Ratio: 2.1 (ref 1.2–2.2)
Albumin: 4.8 g/dL (ref 3.8–4.8)
Alkaline Phosphatase: 51 IU/L (ref 39–117)
BUN/Creatinine Ratio: 19 (ref 12–28)
BUN: 17 mg/dL (ref 8–27)
Bilirubin Total: 0.4 mg/dL (ref 0.0–1.2)
CO2: 24 mmol/L (ref 20–29)
Calcium: 10.1 mg/dL (ref 8.7–10.3)
Chloride: 98 mmol/L (ref 96–106)
Creatinine, Ser: 0.9 mg/dL (ref 0.57–1.00)
GFR calc Af Amer: 75 mL/min/{1.73_m2} (ref >59)
GFR calc non Af Amer: 65 mL/min/{1.73_m2} (ref >59)
Globulin, Total: 2.3 g/dL (ref 1.5–4.5)
Glucose: 82 mg/dL (ref 65–99)
Potassium: 4.2 mmol/L (ref 3.5–5.2)
Sodium: 139 mmol/L (ref 134–144)
Total Protein: 7.1 g/dL (ref 6.0–8.5)

## 2019-07-20 LAB — LIPID PANEL
Chol/HDL Ratio: 3.1 ratio (ref 0.0–4.4)
Cholesterol, Total: 152 mg/dL (ref 100–199)
HDL: 49 mg/dL (ref >39)
LDL Chol Calc (NIH): 74 mg/dL (ref 0–99)
Triglycerides: 168 mg/dL — ABNORMAL HIGH (ref 0–149)
VLDL Cholesterol Cal: 29 mg/dL (ref 5–40)

## 2019-10-20 DIAGNOSIS — Z1231 Encounter for screening mammogram for malignant neoplasm of breast: Secondary | ICD-10-CM | POA: Diagnosis not present

## 2019-10-28 ENCOUNTER — Encounter: Payer: Self-pay | Admitting: Nurse Practitioner

## 2019-10-28 ENCOUNTER — Other Ambulatory Visit: Payer: Self-pay

## 2019-10-28 ENCOUNTER — Ambulatory Visit: Payer: Medicare PPO | Admitting: Nurse Practitioner

## 2019-10-28 VITALS — BP 135/84 | HR 79 | Temp 97.8°F | Resp 20 | Ht 63.0 in | Wt 161.0 lb

## 2019-10-28 DIAGNOSIS — N898 Other specified noninflammatory disorders of vagina: Secondary | ICD-10-CM

## 2019-10-28 LAB — URINALYSIS, COMPLETE
Bilirubin, UA: NEGATIVE
Glucose, UA: NEGATIVE
Ketones, UA: NEGATIVE
Nitrite, UA: NEGATIVE
Protein,UA: NEGATIVE
Specific Gravity, UA: 1.02 (ref 1.005–1.030)
Urobilinogen, Ur: 0.2 mg/dL (ref 0.2–1.0)
pH, UA: 5 (ref 5.0–7.5)

## 2019-10-28 LAB — MICROSCOPIC EXAMINATION: Renal Epithel, UA: NONE SEEN /hpf

## 2019-10-28 LAB — WET PREP FOR TRICH, YEAST, CLUE
Clue Cell Exam: NEGATIVE
Trichomonas Exam: NEGATIVE
Yeast Exam: NEGATIVE

## 2019-10-28 MED ORDER — FLUCONAZOLE 150 MG PO TABS: 150.0000 mg | ORAL_TABLET | Freq: Once | ORAL | 0 refills | Status: AC

## 2019-10-28 NOTE — Progress Notes (Signed)
   Subjective:    Patient ID: Lauren Dunn, female    DOB: 03-05-1951, 69 y.o.   MRN: [id number]   Chief Complaint: Vaginal Itching, vaginal irritation, and urine urgency   HPI Patient come sin c/o vaginal itching  and perineal irritation. He also ha had some urinary  Urgency. She used monistat OTC which helped some.    Review of Systems  Constitutional: Negative.   Respiratory: Negative.   Cardiovascular: Negative.   Genitourinary: Positive for urgency and vaginal discharge. Negative for dysuria, frequency and vaginal pain.  All other systems reviewed and are negative.      Objective:   Physical Exam Vitals and nursing note reviewed.  Constitutional:      Appearance: Normal appearance.  Cardiovascular:     Rate and Rhythm: Normal rate and regular rhythm.     Heart sounds: Normal heart sounds.  Pulmonary:     Breath sounds: Normal breath sounds.  Genitourinary:    Comments: Not examined Neurological:     Mental Status: She is alert.    BP 135/84   Pulse 79   Temp 97.8 F (36.6 C) (Temporal)   Resp 20   Ht 5\' 3"  (1.6 m)   Wt 161 lb (73 kg)   SpO2 97%   BMI 28.52 kg/m         Assessment & Plan:  Lauren Dunn in today with chief complaint of Vaginal Itching, vaginal irritation, and urine urgency   1. Vaginal itching Monistat OTC externally - Urinalysis, Complete - WET PREP FOR TRICH, YEAST, CLUE - fluconazole (DIFLUCAN) 150 MG tablet; Take 1 tablet (150 mg total) by mouth once for 1 dose.  Dispense: 1 tablet; Refill: 0    The above assessment and management plan was discussed with the patient. The patient verbalized understanding of and has agreed to the management plan. Patient is aware to call the clinic if symptoms persist or worsen. Patient is aware when to return to the clinic for a follow-up visit. Patient educated on when it is appropriate to go to the emergency department.   [name], FNP

## 2019-10-28 NOTE — Patient Instructions (Addendum)
Vaginal Yeast Infection, Adult  Vaginal yeast infection is a condition that causes vaginal discharge as well as soreness, swelling, and redness (inflammation) of the vagina. This is a common condition. Some women get this infection frequently. What are the causes? This condition is caused by a change in the normal balance of the yeast (candida) and bacteria that live in the vagina. This change causes an overgrowth of yeast, which causes the inflammation. What increases the risk? The condition is more likely to develop in women who:  Take antibiotic medicines.  Have diabetes.  Take birth control pills.  Are pregnant.  Douche often.  Have a weak body defense system (immune system).  Have been taking steroid medicines for a long time.  Frequently wear tight clothing. What are the signs or symptoms? Symptoms of this condition include:  White, thick, creamy vaginal discharge.  Swelling, itching, redness, and irritation of the vagina. The lips of the vagina (vulva) may be affected as well.  Pain or a burning feeling while urinating.  Pain during sex. How is this diagnosed? This condition is diagnosed based on:  Your medical history.  A physical exam.  A pelvic exam. Your health care provider will examine a sample of your vaginal discharge under a microscope. Your health care provider may send this sample for testing to confirm the diagnosis. How is this treated? This condition is treated with medicine. Medicines may be over-the-counter or prescription. You may be told to use one or more of the following:  Medicine that is taken by mouth (orally).  Medicine that is applied as a cream (topically).  Medicine that is inserted directly into the vagina (suppository). Follow these instructions at home:  Lifestyle  Do not have sex until your health care provider approves. Tell your sex partner that you have a yeast infection. That person should go to his or her health care  provider and ask if they should also be treated.  Do not wear tight clothes, such as pantyhose or tight pants.  Wear breathable cotton underwear. General instructions  Take or apply over-the-counter and prescription medicines only as told by your health care provider.  Eat more yogurt. This may help to keep your yeast infection from returning.  Do not use tampons until your health care provider approves.  Try taking a sitz bath to help with discomfort. This is a warm water bath that is taken while you are sitting down. The water should only come up to your hips and should cover your buttocks. Do this 3-4 times per day or as told by your health care provider.  Do not douche.  If you have diabetes, keep your blood sugar levels under control.  Keep all follow-up visits as told by your health care provider. This is important. Contact a health care provider if:  You have a fever.  Your symptoms go away and then return.  Your symptoms do not get better with treatment.  Your symptoms get worse.  You have new symptoms.  You develop blisters in or around your vagina.  You have blood coming from your vagina and it is not your menstrual period.  You develop pain in your abdomen. Summary  Vaginal yeast infection is a condition that causes discharge as well as soreness, swelling, and redness (inflammation) of the vagina.  This condition is treated with medicine. Medicines may be over-the-counter or prescription.  Take or apply over-the-counter and prescription medicines only as told by your health care provider.  Do not douche.   Do not have sex or use tampons until your health care provider approves.  Contact a health care provider if your symptoms do not get better with treatment or your symptoms go away and then return. This information is not intended to replace advice given to you by your health care provider. Make sure you discuss any questions you have with your health care  provider. Document Revised: 12/11/2018 Document Reviewed: 09/29/2017 Elsevier Patient Education  2020 [employer] Inc[employer]  

## 2019-12-02 ENCOUNTER — Encounter: Payer: Self-pay | Admitting: Nurse Practitioner

## 2019-12-02 ENCOUNTER — Ambulatory Visit: Payer: Medicare PPO | Admitting: Nurse Practitioner

## 2019-12-02 ENCOUNTER — Other Ambulatory Visit: Payer: Self-pay

## 2019-12-02 VITALS — BP 144/85 | HR 86 | Temp 97.5°F | Resp 20 | Ht 63.0 in | Wt 164.0 lb

## 2019-12-02 DIAGNOSIS — N904 Leukoplakia of vulva: Secondary | ICD-10-CM | POA: Diagnosis not present

## 2019-12-02 MED ORDER — CLOBETASOL PROP EMOLLIENT BASE 0.05 % EX CREA: TOPICAL_CREAM | CUTANEOUS | 2 refills | Status: DC

## 2019-12-02 NOTE — Patient Instructions (Signed)
Lichen Sclerosus Lichen sclerosus is a skin problem. It can happen on any part of the body. It happens most often in the anal or genital areas. It can cause itching and discomfort. Treatment can help to control symptoms. It can also help prevent scarring that may lead to other problems. What are the causes? The cause of this condition is not known. It is not passed from one person to another (not contagious). What increases the risk? This condition is more likely to develop in women. It most often occurs after menopause. What are the signs or symptoms? Symptoms of this condition include:  Thin, wrinkled, white areas on the skin.  Thickened white areas on the skin.  Red and swollen patches (lesions) on the skin.  Tears or cracks in the skin.  Bruising.  Blood blisters.  Very bad itching.  Pain, itching, or burning when peeing (urinating).  Trouble pooping (constipation). How is this diagnosed? This condition may be diagnosed with a physical exam. A sample of your skin may also be removed to be looked at under a microscope (biopsy). How is this treated? This condition may be treated with:  Creams or ointments (topical steroids) that are put on the skin in the affected areas. This is the most common treatment.  Medicines that are taken by mouth.  Surgery. This is only needed if the condition is very bad and is causing problems such as scarring. Follow these instructions at home:  Take or use over-the-counter and prescription medicines only as told by your doctor.  Use creams or ointments as told by your doctor.  Do not scratch the affected areas of skin.  If you are a woman, keep the vagina as clean and dry as you can.  Clean the affected area of skin gently with water. Avoid using rough towels or toilet paper.  Keep all follow-up visits as told by your doctor. This is important. Contact a doctor if:  Your redness, swelling, or pain gets worse.  You have fluid,  blood, or pus coming from the area.  You have new red and swollen patches on your skin.  You have a fever.  You have pain during sex. Summary  Lichen sclerosus is a skin problem. It can cause itching and discomfort.  This condition is usually treated with creams or ointments that are put on the skin in the affected areas.  Use medicines only as told by your doctor.  Do not scratch the affected areas of skin.  Keep all follow-up visits as told by your doctor. This is important. This information is not intended to replace advice given to you by your health care provider. Make sure you discuss any questions you have with your health care provider. Document Revised: 09/25/2017 Document Reviewed: 09/25/2017 Elsevier Patient Education  2020 [employer] Inc.  

## 2019-12-02 NOTE — Progress Notes (Signed)
   Subjective:    Patient ID: Lauren Dunn, female    DOB: 1951-03-06, 69 y.o.   MRN: [id number]   Chief Complaint: vaginal irritation   HPI Patient comes in today c/o burnig and itching. No vaginal discharge. She was treated for yeast several months ago and no better. Says shhe just feels raw down there. Denies any sexual activity.   Review of Systems  Constitutional: Negative for diaphoresis.  Eyes: Negative for pain.  Respiratory: Negative for shortness of breath.   Cardiovascular: Negative for chest pain, palpitations and leg swelling.  Gastrointestinal: Negative for abdominal pain.  Endocrine: Negative for polydipsia.  Genitourinary: Negative for vaginal bleeding, vaginal discharge and vaginal pain.  Skin: Negative for rash.  Neurological: Negative for dizziness, weakness and headaches.  Hematological: Does not bruise/bleed easily.  All other systems reviewed and are negative.      Objective:   Physical Exam Constitutional:      Appearance: Normal appearance.  Cardiovascular:     Rate and Rhythm: Normal rate and regular rhythm.     Heart sounds: Normal heart sounds.  Pulmonary:     Breath sounds: Normal breath sounds.  Genitourinary:    Vagina: Vaginal discharge present.     Rectum: Normal.     Comments: White atrophic plaque on vulva  Neurological:     Mental Status: She is alert.    BP (!) 144/85   Pulse 86   Temp (!) 97.5 F (36.4 C) (Temporal)   Resp 20   Ht 5\' 3"  (1.6 m)   Wt 164 lb (74.4 kg)   SpO2 97%   BMI 29.05 kg/m         Assessment & Plan:  Lauren Dunn in today with chief complaint of vaginal irritation   1. Lichen sclerosus of female genitalia Avoid scratching Cool bath No harsh soap   Meds ordered this encounter  Medications  . Clobetasol Prop Emollient Base (CLOBETASOL PROPIONATE E) 0.05 % emollient cream    Sig: Apply to affected area bid    Dispense:  60 g    Refill:  2    Order Specific Question:   Supervising Provider      Answer:   Caryl Pina A [[id number]]     The above assessment and management plan was discussed with the patient. The patient verbalized understanding of and has agreed to the management plan. Patient is aware to call the clinic if symptoms persist or worsen. Patient is aware when to return to the clinic for a follow-up visit. Patient educated on when it is appropriate to go to the emergency department.   [name], FNP

## 2020-01-12 ENCOUNTER — Encounter (HOSPITAL_COMMUNITY): Payer: Self-pay

## 2020-01-12 NOTE — Progress Notes (Signed)
I have attempted to call patient to schedule next LDCT. Unable to reach patient at this time. Detailed VM left asking that the patient call me back to scheduled next LDCT.

## 2020-01-20 ENCOUNTER — Other Ambulatory Visit: Payer: Self-pay

## 2020-01-20 ENCOUNTER — Encounter: Payer: Self-pay | Admitting: Nurse Practitioner

## 2020-01-20 ENCOUNTER — Ambulatory Visit (INDEPENDENT_AMBULATORY_CARE_PROVIDER_SITE_OTHER): Payer: Medicare PPO | Admitting: Nurse Practitioner

## 2020-01-20 VITALS — BP 124/82 | HR 78 | Temp 97.5°F | Resp 20 | Ht 63.0 in | Wt 163.0 lb

## 2020-01-20 DIAGNOSIS — M810 Age-related osteoporosis without current pathological fracture: Secondary | ICD-10-CM | POA: Diagnosis not present

## 2020-01-20 DIAGNOSIS — L03031 Cellulitis of right toe: Secondary | ICD-10-CM

## 2020-01-20 DIAGNOSIS — Z720 Tobacco use: Secondary | ICD-10-CM | POA: Diagnosis not present

## 2020-01-20 DIAGNOSIS — E785 Hyperlipidemia, unspecified: Secondary | ICD-10-CM | POA: Diagnosis not present

## 2020-01-20 DIAGNOSIS — Z6828 Body mass index (BMI) 28.0-28.9, adult: Secondary | ICD-10-CM

## 2020-01-20 DIAGNOSIS — I10 Essential (primary) hypertension: Secondary | ICD-10-CM

## 2020-01-20 MED ORDER — LISINOPRIL-HYDROCHLOROTHIAZIDE 20-12.5 MG PO TABS: 1.0000 | ORAL_TABLET | Freq: Every day | ORAL | 1 refills | Status: DC

## 2020-01-20 MED ORDER — ROSUVASTATIN CALCIUM 10 MG PO TABS: 10.0000 mg | ORAL_TABLET | Freq: Every day | ORAL | 1 refills | Status: DC

## 2020-01-20 NOTE — Patient Instructions (Signed)

## 2020-01-20 NOTE — Progress Notes (Signed)
Subjective:    Patient ID: Lauren Dunn, female    DOB: 1950/09/26, 69 y.o.   MRN: [id number]   Chief Complaint: Medical Management of Chronic Issues    HPI:  1. Essential hypertension Denies chest pain, SOB, headaches. Sometimes checks BP at home and it is usually good. Taking zestoretic daily. Is watching sodium intake some. BP Readings from Last 3 Encounters:  01/20/20 124/82  12/02/19 (!) 144/85  10/28/19 135/84     2. Hyperlipidemia with target LDL less than 100 Is not watching fat intake in diet. Lab Results  Component Value Date   CHOL 152 07/19/2019   HDL 49 07/19/2019   LDLCALC 74 07/19/2019   TRIG 168 (H) 07/19/2019   CHOLHDL 3.1 07/19/2019     3. BMI 28.0-28.9,adult No significant changes in weight. BMI Readings from Last 3 Encounters:  01/20/20 28.87 kg/m  12/02/19 29.05 kg/m  10/28/19 28.52 kg/m   Wt Readings from Last 3 Encounters:  01/20/20 163 lb (73.9 kg)  12/02/19 164 lb (74.4 kg)  10/28/19 161 lb (73 kg)     4. Age-related osteoporosis without current pathological fracture Last Dexa scan in February. Does try to walk and work outside. Takes vitamin D and calcium supplements daily.  5. Tobacco abuse Still smoking about a pack a day. Has thought about quitting but is not ready yet.   Outpatient Encounter Medications as of 01/20/2020  Medication Sig  . Ascorbic Acid (VITAMIN C PO) Take by mouth.  Marland Kitchen BIOTIN PO Take by mouth.  Marland Kitchen CALCIUM PO Take by mouth.  . cetirizine (ZYRTEC ALLERGY) 10 MG tablet Take 10 mg by mouth daily.  . Cholecalciferol (VITAMIN D3 PO) Take by mouth.  . Clobetasol Prop Emollient Base (CLOBETASOL PROPIONATE E) 0.05 % emollient cream Apply to affected area bid  . lisinopril-hydrochlorothiazide (ZESTORETIC) 20-12.5 MG tablet Take 1 tablet by mouth daily.  . Multiple Vitamin (MULTIVITAMIN) capsule Take 1 capsule by mouth daily.  . rosuvastatin (CRESTOR) 10 MG tablet Take 1 tablet (10 mg total) by mouth daily.  Marland Kitchen  alendronate (FOSAMAX) 70 MG tablet Take 1 tablet (70 mg total) by mouth every 7 (seven) days. Take with a full glass of water on an empty stomach. (Patient not taking: Reported on 01/20/2020)   No facility-administered encounter medications on file as of 01/20/2020.    Past Surgical History:  Procedure Laterality Date  . COLONOSCOPY  2013    Family History  Problem Relation Age of Onset  . Hypertension Sister   . Diabetes Brother   . Stroke Mother   . Hypertension Mother   . Hypertension Sister   . Colon cancer Neg Hx   . Esophageal cancer Neg Hx   . Stomach cancer Neg Hx   . Rectal cancer Neg Hx   . Colon polyps Neg Hx     New complaints: Has noticed toe fungus on right big toe and would like to see about being treated for it.  Social history: Lives by herself.  Controlled substance contract: n/a     Review of Systems  Constitutional: Negative.   HENT: Negative.   Eyes: Negative.   Respiratory: Positive for cough (chronic nonproductive cough).   Cardiovascular: Negative.   Gastrointestinal: Negative.   Genitourinary: Negative.   Musculoskeletal: Negative.   Skin: Negative.   Neurological: Negative.   Psychiatric/Behavioral: Negative.        Objective:   Physical Exam Vitals reviewed.  Constitutional:      Appearance: Normal appearance.  HENT:     Head: Normocephalic.     Right Ear: Tympanic membrane normal.     Left Ear: Tympanic membrane normal.     Nose: Nose normal.     Mouth/Throat:     Mouth: Mucous membranes are moist.     Pharynx: Oropharynx is clear.  Eyes:     Conjunctiva/sclera: Conjunctivae normal.     Pupils: Pupils are equal, round, and reactive to light.  Cardiovascular:     Rate and Rhythm: Normal rate and regular rhythm.     Pulses: Normal pulses.     Heart sounds: Normal heart sounds.  Pulmonary:     Effort: Pulmonary effort is normal.     Breath sounds: Normal breath sounds.  Abdominal:     General: Bowel sounds are normal.      Palpations: Abdomen is soft.  Musculoskeletal:        General: Normal range of motion.     Cervical back: Normal range of motion.  Skin:    General: Skin is warm and dry.     Capillary Refill: Capillary refill takes less than 2 seconds.     Comments: Yellow discolortaion and thickening of right great nail bed  Neurological:     Mental Status: She is alert and oriented to person, place, and time.  Psychiatric:        Mood and Affect: Mood normal.        Behavior: Behavior normal.   BP 124/82   Pulse 78   Temp (!) 97.5 F (36.4 C) (Temporal)   Resp 20   Ht 5\' 3"  (1.6 m)   Wt 163 lb (73.9 kg)   SpO2 97%   BMI 28.87 kg/m      Assessment & Plan:   Lauren Dunn comes in today with chief complaint of Medical Management of Chronic Issues   Diagnosis and orders addressed:  1. Essential hypertension Low sodium diet.  2. Hyperlipidemia with target LDL less than 100 Low fat diet.  3. BMI 28.0-28.9,adult Discussed diet and exercise for person with BMI >25 Will recheck weight in 3-6 months  4. Age-related osteoporosis without current pathological fracture Continue vitamin D/calcium supplements. Weight-bearing exercise as tolerated. Checking on prolia injections  5. Tobacco abuse Smoking cessation encouraged.  6. onchymycosis Will try OTC cream  Labs pending Health Maintenance reviewed Diet and exercise encouraged  Follow up plan: 6 months   [place], FNP

## 2020-01-21 LAB — CBC WITH DIFFERENTIAL/PLATELET
Basophils Absolute: 0.1 10*3/uL (ref 0.0–0.2)
Basos: 1 %
EOS (ABSOLUTE): 0.2 10*3/uL (ref 0.0–0.4)
Eos: 2 %
Hematocrit: 38.5 % (ref 34.0–46.6)
Hemoglobin: 13.2 g/dL (ref 11.1–15.9)
Immature Grans (Abs): 0 10*3/uL (ref 0.0–0.1)
Immature Granulocytes: 0 %
Lymphocytes Absolute: 2.3 10*3/uL (ref 0.7–3.1)
Lymphs: 28 %
MCH: 31.6 pg (ref 26.6–33.0)
MCHC: 34.3 g/dL (ref 31.5–35.7)
MCV: 92 fL (ref 79–97)
Monocytes Absolute: 0.6 10*3/uL (ref 0.1–0.9)
Monocytes: 8 %
Neutrophils Absolute: 5.2 10*3/uL (ref 1.4–7.0)
Neutrophils: 61 %
Platelets: 253 10*3/uL (ref 150–450)
RBC: 4.18 x10E6/uL (ref 3.77–5.28)
RDW: 13.1 % (ref 11.7–15.4)
WBC: 8.4 10*3/uL (ref 3.4–10.8)

## 2020-01-21 LAB — LIPID PANEL
Chol/HDL Ratio: 4 ratio (ref 0.0–4.4)
Cholesterol, Total: 152 mg/dL (ref 100–199)
HDL: 38 mg/dL — ABNORMAL LOW (ref >39)
LDL Chol Calc (NIH): 73 mg/dL (ref 0–99)
Triglycerides: 249 mg/dL — ABNORMAL HIGH (ref 0–149)
VLDL Cholesterol Cal: 41 mg/dL — ABNORMAL HIGH (ref 5–40)

## 2020-01-21 LAB — CMP14+EGFR
ALT: 18 IU/L (ref 0–32)
AST: 19 IU/L (ref 0–40)
Albumin/Globulin Ratio: 2 (ref 1.2–2.2)
Albumin: 4.6 g/dL (ref 3.8–4.8)
Alkaline Phosphatase: 53 IU/L (ref 48–121)
BUN/Creatinine Ratio: 17 (ref 12–28)
BUN: 15 mg/dL (ref 8–27)
Bilirubin Total: 0.4 mg/dL (ref 0.0–1.2)
CO2: 26 mmol/L (ref 20–29)
Calcium: 9.7 mg/dL (ref 8.7–10.3)
Chloride: 103 mmol/L (ref 96–106)
Creatinine, Ser: 0.9 mg/dL (ref 0.57–1.00)
GFR calc Af Amer: 75 mL/min/{1.73_m2} (ref >59)
GFR calc non Af Amer: 65 mL/min/{1.73_m2} (ref >59)
Globulin, Total: 2.3 g/dL (ref 1.5–4.5)
Glucose: 89 mg/dL (ref 65–99)
Potassium: 4.1 mmol/L (ref 3.5–5.2)
Sodium: 142 mmol/L (ref 134–144)
Total Protein: 6.9 g/dL (ref 6.0–8.5)

## 2020-01-25 ENCOUNTER — Encounter (HOSPITAL_COMMUNITY): Payer: Self-pay

## 2020-01-25 NOTE — Progress Notes (Signed)
I have attempted to reach patient to schedule next LDCT, unable to reach patient at this time. Detailed VM left requesting a call back.

## 2020-02-03 ENCOUNTER — Encounter (HOSPITAL_COMMUNITY): Payer: Self-pay

## 2020-02-11 ENCOUNTER — Other Ambulatory Visit (HOSPITAL_COMMUNITY): Payer: Self-pay

## 2020-02-11 DIAGNOSIS — Z122 Encounter for screening for malignant neoplasm of respiratory organs: Secondary | ICD-10-CM

## 2020-02-11 DIAGNOSIS — Z87891 Personal history of nicotine dependence: Secondary | ICD-10-CM

## 2020-02-11 NOTE — Progress Notes (Signed)
Patient has returned my call to scheduled follow-up LDCT through the [facility]. Patient scheduled for LDCT on 03/03/2020 at 0800. Patient aware.

## 2020-03-03 ENCOUNTER — Ambulatory Visit (HOSPITAL_COMMUNITY)
Admission: RE | Admit: 2020-03-03 | Discharge: 2020-03-03 | Disposition: A | Discharge status: Home / Self Care | Payer: Medicare PPO | Source: Ambulatory Visit | Attending: Nurse Practitioner | Admitting: Nurse Practitioner

## 2020-03-03 ENCOUNTER — Other Ambulatory Visit: Payer: Self-pay

## 2020-03-03 DIAGNOSIS — Z87891 Personal history of nicotine dependence: Secondary | ICD-10-CM | POA: Diagnosis not present

## 2020-03-03 DIAGNOSIS — Z122 Encounter for screening for malignant neoplasm of respiratory organs: Secondary | ICD-10-CM | POA: Diagnosis not present

## 2020-03-06 ENCOUNTER — Encounter (HOSPITAL_COMMUNITY): Payer: Self-pay

## 2020-03-06 NOTE — Progress Notes (Signed)
Patient underwent LDCT through the [facility]. Patient has been mailed a copy of results. PCP aware of results. I will contact the patient in approximately 11 months to schedule follow-up. Results are as follows:  IMPRESSION: 1. Lung-RADS 2, benign appearance or behavior. Continue annual screening with low-dose chest CT without contrast in 12 months. 2. Aortic Atherosclerosis (ICD10-I70.0) and Emphysema (ICD10-J43.9).

## 2020-07-07 ENCOUNTER — Other Ambulatory Visit: Payer: Self-pay

## 2020-07-07 ENCOUNTER — Encounter: Payer: Self-pay | Admitting: Nurse Practitioner

## 2020-07-07 ENCOUNTER — Ambulatory Visit: Payer: Medicare PPO | Admitting: Nurse Practitioner

## 2020-07-07 VITALS — BP 135/83 | HR 84 | Temp 97.5°F | Resp 20 | Ht 63.0 in | Wt 169.0 lb

## 2020-07-07 DIAGNOSIS — M7551 Bursitis of right shoulder: Secondary | ICD-10-CM

## 2020-07-07 MED ORDER — PREDNISONE 10 MG (21) PO TBPK: ORAL_TABLET | ORAL | 0 refills | Status: DC

## 2020-07-07 MED ORDER — KETOROLAC TROMETHAMINE 60 MG/2ML IM SOLN
60.0000 mg | Freq: Once | INTRAMUSCULAR | Status: AC
  Administered 2020-07-07: 60 mg via INTRAMUSCULAR

## 2020-07-07 MED ORDER — TRAMADOL HCL 50 MG PO TABS: 50.0000 mg | ORAL_TABLET | Freq: Three times a day (TID) | ORAL | 0 refills | Status: AC | PRN

## 2020-07-07 NOTE — Patient Instructions (Signed)
Shoulder Pain Many things can cause shoulder pain, including:  An injury.  Moving the shoulder in the same way again and again (overuse).  Joint pain (arthritis). Pain can come from:  Swelling and irritation (inflammation) of any part of the shoulder.  An injury to the shoulder joint.  An injury to: ? Tissues that connect muscle to bone (tendons). ? Tissues that connect bones to each other (ligaments). ? Bones. Follow these instructions at home: Watch for changes in your symptoms. Let your doctor know about them. Follow these instructions to help with your pain. If you have a sling:  Wear the sling as told by your doctor. Remove it only as told by your doctor.  Loosen the sling if your fingers: ? Tingle. ? Become numb. ? Turn cold and blue.  Keep the sling clean.  If the sling is not waterproof: ? Do not let it get wet. ? Take the sling off when you shower or bathe. Managing pain, stiffness, and swelling  If told, put ice on the painful area: ? Put ice in a plastic bag. ? Place a towel between your skin and the bag. ? Leave the ice on for 20 minutes, 2-3 times a day. Stop putting ice on if it does not help with the pain.  Squeeze a soft ball or a foam pad as much as possible. This prevents swelling in the shoulder. It also helps to strengthen the arm.   General instructions  Take over-the-counter and prescription medicines only as told by your doctor.  Keep all follow-up visits as told by your doctor. This is important. Contact a doctor if:  Your pain gets worse.  Medicine does not help your pain.  You have new pain in your arm, hand, or fingers. Get help right away if:  Your arm, hand, or fingers: ? Tingle. ? Are numb. ? Are swollen. ? Are painful. ? Turn white or blue. Summary  Shoulder pain can be caused by many things. These include injury, moving the shoulder in the same away again and again, and joint pain.  Watch for changes in your symptoms.  Let your doctor know about them.  This condition may be treated with a sling, ice, and pain medicine.  Contact your doctor if the pain gets worse or you have new pain. Get help right away if your arm, hand, or fingers tingle or get numb, swollen, or painful.  Keep all follow-up visits as told by your doctor. This is important. This information is not intended to replace advice given to you by your health care provider. Make sure you discuss any questions you have with your health care provider. Document Revised: 11/25/2017 Document Reviewed: 11/25/2017 Elsevier Patient Education  2021 [employer] Inc.  

## 2020-07-07 NOTE — Progress Notes (Signed)
   Subjective:    Patient ID: EILAH COMMON, female    DOB: 12-Jul-1950, 70 y.o.   MRN: [id number]   Chief Complaint: Shoulder Pain (Right. No injury/)   HPI Patient come sin today c/o right shoulder pain. Started 1 week ago. Said she woke up with it hurting. Has gradually gotten worse. Rates pain 6/10 right now. Laying down seems to increase pain as well as some movements. Resting helps some. She has taken ibuprofen and meloxicam with no relief. She denies any distal numbness or tingling. Denies any injury.   Review of Systems  Constitutional: Negative for diaphoresis.  Eyes: Negative for pain.  Respiratory: Negative for shortness of breath.   Cardiovascular: Negative for chest pain, palpitations and leg swelling.  Gastrointestinal: Negative for abdominal pain.  Endocrine: Negative for polydipsia.  Musculoskeletal: Positive for arthralgias (right shoulder).  Skin: Negative for rash.  Neurological: Negative for dizziness, weakness and headaches.  Hematological: Does not bruise/bleed easily.  All other systems reviewed and are negative.      Objective:   Physical Exam Vitals and nursing note reviewed.  Constitutional:      Appearance: Normal appearance.  Cardiovascular:     Rate and Rhythm: Normal rate and regular rhythm.     Heart sounds: Normal heart sounds.  Pulmonary:     Breath sounds: Normal breath sounds.  Musculoskeletal:     Comments: Decrease ROM of right shoulder with pain on extension and internal rotation.  No point tenderness Grips equal bil.   Skin:    General: Skin is warm.  Neurological:     General: No focal deficit present.     Mental Status: She is alert and oriented to person, place, and time.  Psychiatric:        Mood and Affect: Mood normal.        Behavior: Behavior normal.    BP 135/83   Pulse 84   Temp (!) 97.5 F (36.4 C) (Temporal)   Resp 20   Ht 5\' 3"  (1.6 m)   Wt 169 lb (76.7 kg)   SpO2 100%   BMI 29.94 kg/m          Assessment & Plan:  KAYTELYNN SCRIPTER in today with chief complaint of Shoulder Pain (Right. No injury/)   1. Bursitis of right shoulder Moist heat  rest - ketorolac (TORADOL) injection 60 mg - predniSONE (STERAPRED UNI-PAK 21 TAB) 10 MG (21) TBPK tablet; As directed x 6 days  Dispense: 21 tablet; Refill: 0 - traMADol (ULTRAM) 50 MG tablet; Take 1 tablet (50 mg total) by mouth every 8 (eight) hours as needed for up to 5 days.  Dispense: 15 tablet; Refill: 0    The above assessment and management plan was discussed with the patient. The patient verbalized understanding of and has agreed to the management plan. Patient is aware to call the clinic if symptoms persist or worsen. Patient is aware when to return to the clinic for a follow-up visit. Patient educated on when it is appropriate to go to the emergency department.   [name], FNP

## 2020-07-24 ENCOUNTER — Ambulatory Visit: Payer: Medicare PPO | Admitting: Nurse Practitioner

## 2020-07-24 ENCOUNTER — Other Ambulatory Visit: Payer: Self-pay

## 2020-07-24 ENCOUNTER — Encounter: Payer: Self-pay | Admitting: Nurse Practitioner

## 2020-07-24 VITALS — BP 142/84 | HR 77 | Temp 98.1°F | Resp 20 | Ht 63.0 in | Wt 163.0 lb

## 2020-07-24 DIAGNOSIS — E785 Hyperlipidemia, unspecified: Secondary | ICD-10-CM

## 2020-07-24 DIAGNOSIS — I10 Essential (primary) hypertension: Secondary | ICD-10-CM | POA: Diagnosis not present

## 2020-07-24 DIAGNOSIS — Z6828 Body mass index (BMI) 28.0-28.9, adult: Secondary | ICD-10-CM | POA: Diagnosis not present

## 2020-07-24 DIAGNOSIS — M810 Age-related osteoporosis without current pathological fracture: Secondary | ICD-10-CM | POA: Diagnosis not present

## 2020-07-24 DIAGNOSIS — Z720 Tobacco use: Secondary | ICD-10-CM

## 2020-07-24 MED ORDER — ROSUVASTATIN CALCIUM 10 MG PO TABS: 10.0000 mg | ORAL_TABLET | Freq: Every day | ORAL | 1 refills | Status: DC

## 2020-07-24 MED ORDER — LISINOPRIL-HYDROCHLOROTHIAZIDE 20-12.5 MG PO TABS: 1.0000 | ORAL_TABLET | Freq: Every day | ORAL | 1 refills | Status: DC

## 2020-07-24 NOTE — Progress Notes (Signed)
Subjective:    Patient ID: Lauren Dunn, female    DOB: [birth year], 70 y.o.   MRN: [id number]   Chief Complaint: medical management of chronic issues     HPI:  1. Primary hypertension No c/o chest pain, sob or headache. Dos  Not check blood pressure at home. BP Readings from Last 3 Encounters:  07/07/20 135/83  01/20/20 124/82  12/02/19 (!) 144/85     2. Hyperlipidemia with target LDL less than 100 Does not watch diet and does very little exercise Lab Results  Component Value Date   CHOL 152 01/20/2020   HDL 38 (L) 01/20/2020   LDLCALC 73 01/20/2020   TRIG 249 (H) 01/20/2020   CHOLHDL 4.0 01/20/2020     3. Tobacco abuse Smokes over a pack a day  4. Age-related osteoporosis without current pathological fracture Last dexascan was done 07/19/19. t score was -2.2. is not on fosamax currently. Probably needs to be on prolia.  5. BMI 28.0-28.9,adult Weight is down 6lbs  Wt Readings from Last 3 Encounters:  07/24/20 163 lb (73.9 kg)  07/07/20 169 lb (76.7 kg)  01/20/20 163 lb (73.9 kg)    BMI Readings from Last 3 Encounters:  07/07/20 29.94 kg/m  01/20/20 28.87 kg/m  12/02/19 29.05 kg/m       Outpatient Encounter Medications as of 07/24/2020  Medication Sig  . Ascorbic Acid (VITAMIN C PO) Take by mouth.  Marland Kitchen BIOTIN PO Take by mouth.  Marland Kitchen CALCIUM PO Take by mouth.  . cetirizine (ZYRTEC) 10 MG tablet Take 10 mg by mouth daily.  . Cholecalciferol (VITAMIN D3 PO) Take by mouth.  . Clobetasol Prop Emollient Base (CLOBETASOL PROPIONATE E) 0.05 % emollient cream Apply to affected area bid  . lisinopril-hydrochlorothiazide (ZESTORETIC) 20-12.5 MG tablet Take 1 tablet by mouth daily.  . Multiple Vitamin (MULTIVITAMIN) capsule Take 1 capsule by mouth daily.  . predniSONE (STERAPRED UNI-PAK 21 TAB) 10 MG (21) TBPK tablet As directed x 6 days  . rosuvastatin (CRESTOR) 10 MG tablet Take 1 tablet (10 mg total) by mouth daily.     Past Surgical History:  Procedure  Laterality Date  . COLONOSCOPY  2013    Family History  Problem Relation Age of Onset  . Hypertension Sister   . Diabetes Brother   . Stroke Mother   . Hypertension Mother   . Hypertension Sister   . Colon cancer Neg Hx   . Esophageal cancer Neg Hx   . Stomach cancer Neg Hx   . Rectal cancer Neg Hx   . Colon polyps Neg Hx     New complaints: None today  Social history: Lives by herself is retired  Controlled substance contract: n/a    Review of Systems  Constitutional: Negative for diaphoresis.  Eyes: Negative for pain.  Respiratory: Negative for shortness of breath.   Cardiovascular: Negative for chest pain, palpitations and leg swelling.  Gastrointestinal: Negative for abdominal pain.  Endocrine: Negative for polydipsia.  Skin: Negative for rash.  Neurological: Negative for dizziness, weakness and headaches.  Hematological: Does not bruise/bleed easily.  All other systems reviewed and are negative.      Objective:   Physical Exam Vitals and nursing note reviewed.  Constitutional:      General: She is not in acute distress.    Appearance: Normal appearance. She is well-developed and well-nourished.  HENT:     Head: Normocephalic.     Nose: Nose normal.     Mouth/Throat:  Mouth: Oropharynx is clear and moist.  Eyes:     Extraocular Movements: EOM normal.     Pupils: Pupils are equal, round, and reactive to light.  Neck:     Vascular: No carotid bruit or JVD.  Cardiovascular:     Rate and Rhythm: Normal rate and regular rhythm.     Pulses: Intact distal pulses.     Heart sounds: Normal heart sounds.  Pulmonary:     Effort: Pulmonary effort is normal. No respiratory distress.     Breath sounds: Normal breath sounds. No wheezing or rales.  Chest:     Chest wall: No tenderness.  Abdominal:     General: Bowel sounds are normal. There is no distension or abdominal bruit. Aorta is normal.     Palpations: Abdomen is soft. There is no hepatomegaly,  splenomegaly, mass or pulsatile mass.     Tenderness: There is no abdominal tenderness.  Musculoskeletal:        General: No edema. Normal range of motion.     Cervical back: Normal range of motion and neck supple.  Lymphadenopathy:     Cervical: No cervical adenopathy.  Skin:    General: Skin is warm and dry.  Neurological:     Mental Status: She is alert and oriented to person, place, and time.     Deep Tendon Reflexes: Reflexes are normal and symmetric.  Psychiatric:        Mood and Affect: Mood and affect normal.        Behavior: Behavior normal.        Thought Content: Thought content normal.        Judgment: Judgment normal.    BP (!) 142/84   Pulse 77   Temp 98.1 F (36.7 C) (Temporal)   Resp 20   Ht '5\' 3"'  (1.6 m)   Wt 163 lb (73.9 kg)   SpO2 98%   BMI 28.87 kg/m         Assessment & Plan:  LAKEA MITTELMAN comes in today with chief complaint of Medical Management of Chronic Issues   Diagnosis and orders addressed:  1. Primary hypertension Low sodium diet - CBC with Differential/Platelet - CMP14+EGFR  lisinopril-hydrochlorothiazide (ZESTORETIC) 20-12.5 MG tablet; Take 1 tablet by mouth daily.  Dispense: 90 tablet; Refill: 1   2. Hyperlipidemia with target LDL less than 100 Low fat diet - rosuvastatin (CRESTOR) 10 MG tablet; Take 1 tablet (10 mg total) by mouth daily.  Dispense: 90 tablet; Refill: 1 - Lipid panel  3. Tobacco abuse Smoking cessation encouraged  4. Age-related osteoporosis without current pathological fracture Weight bearing exercises  5. BMI 28.0-28.9,adult Discussed diet and exercise for person with BMI >25 Will recheck weight in 3-6 months'  Labs pending Health Maintenance reviewed Diet and exercise encouraged  Follow up plan: 6 months   [place], FNP

## 2020-07-24 NOTE — Patient Instructions (Signed)

## 2020-08-04 ENCOUNTER — Telehealth: Payer: Self-pay

## 2020-08-04 NOTE — Telephone Encounter (Signed)
Patient needs to start Prolia per MMM

## 2020-08-24 NOTE — Telephone Encounter (Signed)
Called patient to let her know we are in the process of getting a PA for her insurance.  Will call her when ready to schedule.

## 2020-08-25 NOTE — Telephone Encounter (Signed)
PA in process for prolia   Key: [facility] - PA Case ID: [id number]

## 2020-08-28 NOTE — Telephone Encounter (Signed)
PA Case: [id number], Status: Approved, Coverage Starts on: 09/01/2020 12:00:00 AM, Coverage Ends on: 05/26/2021 12:00:00 AM. Questions? Contact [phone].   Patient aware and prolia ordered.

## 2020-08-29 NOTE — Telephone Encounter (Signed)
Left message for patient to call back to schedule prolia injection. Prolia is in refrigerator.

## 2020-08-30 NOTE — Telephone Encounter (Signed)
Scheduled for 09/04/2020

## 2020-09-04 ENCOUNTER — Ambulatory Visit (INDEPENDENT_AMBULATORY_CARE_PROVIDER_SITE_OTHER): Payer: Medicare PPO | Admitting: *Deleted

## 2020-09-04 ENCOUNTER — Other Ambulatory Visit: Payer: Self-pay

## 2020-09-04 DIAGNOSIS — M810 Age-related osteoporosis without current pathological fracture: Secondary | ICD-10-CM | POA: Diagnosis not present

## 2020-09-04 MED ORDER — DENOSUMAB 60 MG/ML ~~LOC~~ SOSY
60.0000 mg | PREFILLED_SYRINGE | Freq: Once | SUBCUTANEOUS | Status: AC
Start: 2020-09-04 — End: 2020-09-04
  Administered 2020-09-04: 60 mg via SUBCUTANEOUS

## 2020-09-04 NOTE — Progress Notes (Signed)
Pt tolerated Prolia injection well.

## 2020-11-30 ENCOUNTER — Other Ambulatory Visit: Payer: Self-pay | Admitting: Nurse Practitioner

## 2020-11-30 DIAGNOSIS — Z1231 Encounter for screening mammogram for malignant neoplasm of breast: Secondary | ICD-10-CM

## 2020-12-04 ENCOUNTER — Ambulatory Visit
Admission: RE | Admit: 2020-12-04 | Discharge: 2020-12-04 | Disposition: A | Discharge status: Home / Self Care | Payer: Medicare PPO | Source: Ambulatory Visit | Attending: Nurse Practitioner | Admitting: Nurse Practitioner

## 2020-12-04 ENCOUNTER — Other Ambulatory Visit: Payer: Self-pay

## 2020-12-04 DIAGNOSIS — Z1231 Encounter for screening mammogram for malignant neoplasm of breast: Secondary | ICD-10-CM

## 2021-01-21 ENCOUNTER — Other Ambulatory Visit: Payer: Self-pay | Admitting: Nurse Practitioner

## 2021-01-21 DIAGNOSIS — E785 Hyperlipidemia, unspecified: Secondary | ICD-10-CM

## 2021-01-21 DIAGNOSIS — I10 Essential (primary) hypertension: Secondary | ICD-10-CM

## 2021-01-22 ENCOUNTER — Other Ambulatory Visit: Payer: Self-pay

## 2021-01-22 ENCOUNTER — Ambulatory Visit: Payer: Medicare PPO | Admitting: Nurse Practitioner

## 2021-01-22 ENCOUNTER — Encounter: Payer: Self-pay | Admitting: Nurse Practitioner

## 2021-01-22 VITALS — BP 136/77 | HR 69 | Temp 97.7°F | Resp 20 | Ht 63.0 in | Wt 154.0 lb

## 2021-01-22 DIAGNOSIS — M810 Age-related osteoporosis without current pathological fracture: Secondary | ICD-10-CM

## 2021-01-22 DIAGNOSIS — Z6828 Body mass index (BMI) 28.0-28.9, adult: Secondary | ICD-10-CM

## 2021-01-22 DIAGNOSIS — E785 Hyperlipidemia, unspecified: Secondary | ICD-10-CM | POA: Diagnosis not present

## 2021-01-22 DIAGNOSIS — F17200 Nicotine dependence, unspecified, uncomplicated: Secondary | ICD-10-CM

## 2021-01-22 DIAGNOSIS — I10 Essential (primary) hypertension: Secondary | ICD-10-CM | POA: Diagnosis not present

## 2021-01-22 MED ORDER — ROSUVASTATIN CALCIUM 10 MG PO TABS: 10.0000 mg | ORAL_TABLET | Freq: Every day | ORAL | 1 refills | Status: DC

## 2021-01-22 MED ORDER — LISINOPRIL-HYDROCHLOROTHIAZIDE 20-12.5 MG PO TABS: 1.0000 | ORAL_TABLET | Freq: Every day | ORAL | 1 refills | Status: DC

## 2021-01-22 NOTE — Patient Instructions (Signed)

## 2021-01-22 NOTE — Progress Notes (Signed)
Subjective:    Patient ID: PARVEEN FREEHLING, female    DOB: Mar 02, 1951, 70 y.o.   MRN: [id number]   Chief Complaint: Medical Management of Chronic Issues    HPI:  1. Primary hypertension No c/o chest pain, sob or headache. Doe snot check blood pressure at home. BP Readings from Last 3 Encounters:  01/22/21 136/77  07/24/20 (!) 142/84  07/07/20 135/83      2. Hyperlipidemia with target LDL less than 100 Does try to watch diet but does no dedicated exercise. Lab Results  Component Value Date   CHOL 152 01/20/2020   HDL 38 (L) 01/20/2020   LDLCALC 73 01/20/2020   TRIG 249 (H) 01/20/2020   CHOLHDL 4.0 01/20/2020     3. Smoker Still smokes over a pack a day.  4. Age-related osteoporosis without current pathological fracture Last dexascan was done 07/19/19. Her tscore was -2.2.   5. BMI 28.0-28.9,adult No recent weight changes Wt Readings from Last 3 Encounters:  01/22/21 154 lb (69.9 kg)  07/24/20 163 lb (73.9 kg)  07/07/20 169 lb (76.7 kg)   BMI Readings from Last 3 Encounters:  01/22/21 27.28 kg/m  07/24/20 28.87 kg/m  07/07/20 29.94 kg/m      Outpatient Encounter Medications as of 01/22/2021  Medication Sig   Ascorbic Acid (VITAMIN C PO) Take by mouth.   BIOTIN PO Take by mouth.   CALCIUM PO Take by mouth.   cetirizine (ZYRTEC) 10 MG tablet Take 10 mg by mouth daily.   Cholecalciferol (VITAMIN D3 PO) Take by mouth.   Clobetasol Prop Emollient Base (CLOBETASOL PROPIONATE E) 0.05 % emollient cream Apply to affected area bid   lisinopril-hydrochlorothiazide (ZESTORETIC) 20-12.5 MG tablet Take 1 tablet by mouth daily.   Multiple Vitamin (MULTIVITAMIN) capsule Take 1 capsule by mouth daily.   rosuvastatin (CRESTOR) 10 MG tablet Take 1 tablet (10 mg total) by mouth daily.   No facility-administered encounter medications on file as of 01/22/2021.    Past Surgical History:  Procedure Laterality Date   COLONOSCOPY  2013    Family History  Problem Relation  Age of Onset   Hypertension Sister    Diabetes Brother    Stroke Mother    Hypertension Mother    Hypertension Sister    Colon cancer Neg Hx    Esophageal cancer Neg Hx    Stomach cancer Neg Hx    Rectal cancer Neg Hx    Colon polyps Neg Hx     New complaints: None today  Social history: Lives by herself  Controlled substance contract: n/a      Review of Systems  Constitutional:  Negative for diaphoresis.  Eyes:  Negative for pain.  Respiratory:  Negative for shortness of breath.   Cardiovascular:  Negative for chest pain, palpitations and leg swelling.  Gastrointestinal:  Negative for abdominal pain.  Endocrine: Negative for polydipsia.  Skin:  Negative for rash.  Neurological:  Negative for dizziness, weakness and headaches.  Hematological:  Does not bruise/bleed easily.  All other systems reviewed and are negative.     Objective:   Physical Exam Vitals and nursing note reviewed.  Constitutional:      General: She is not in acute distress.    Appearance: Normal appearance. She is well-developed.  HENT:     Head: Normocephalic.     Right Ear: Tympanic membrane normal.     Left Ear: Tympanic membrane normal.     Nose: Nose normal.     Mouth/Throat:  Mouth: Mucous membranes are moist.  Eyes:     Pupils: Pupils are equal, round, and reactive to light.  Neck:     Vascular: No carotid bruit or JVD.  Cardiovascular:     Rate and Rhythm: Normal rate and regular rhythm.     Heart sounds: Normal heart sounds.  Pulmonary:     Effort: Pulmonary effort is normal. No respiratory distress.     Breath sounds: Normal breath sounds. No wheezing or rales.  Chest:     Chest wall: No tenderness.  Abdominal:     General: Bowel sounds are normal. There is no distension or abdominal bruit.     Palpations: Abdomen is soft. There is no hepatomegaly, splenomegaly, mass or pulsatile mass.     Tenderness: There is no abdominal tenderness.  Musculoskeletal:        General:  Normal range of motion.     Cervical back: Normal range of motion and neck supple.  Lymphadenopathy:     Cervical: No cervical adenopathy.  Skin:    General: Skin is warm and dry.  Neurological:     Mental Status: She is alert and oriented to person, place, and time.     Deep Tendon Reflexes: Reflexes are normal and symmetric.  Psychiatric:        Behavior: Behavior normal.        Thought Content: Thought content normal.        Judgment: Judgment normal.    BP 136/77   Pulse 69   Temp 97.7 F (36.5 C) (Temporal)   Resp 20   Ht '5\' 3"'  (1.6 m)   Wt 154 lb (69.9 kg)   SpO2 97%   BMI 27.28 kg/m        Assessment & Plan:   NORAA PICKERAL comes in today with chief complaint of Medical Management of Chronic Issues   Diagnosis and orders addressed:  1. Primary hypertension Low sodium diet - lisinopril-hydrochlorothiazide (ZESTORETIC) 20-12.5 MG tablet; Take 1 tablet by mouth daily.  Dispense: 90 tablet; Refill: 1 - CBC with Differential/Platelet - CMP14+EGFR  2. Hyperlipidemia with target LDL less than 100 Low fat diet - rosuvastatin (CRESTOR) 10 MG tablet; Take 1 tablet (10 mg total) by mouth daily.  Dispense: 90 tablet; Refill: 1 - Lipid panel  3. Smoker Has tried to cut back  4. Age-related osteoporosis without current pathological fracture Weight bearing exercises  5. BMI 28.0-28.9,adult Discussed diet and exercise for person with BMI >25 Will recheck weight in 3-6 months    Labs pending Health Maintenance reviewed Diet and exercise encouraged  Follow up plan: 6 months   [name], FNP

## 2021-01-23 LAB — CBC WITH DIFFERENTIAL/PLATELET
Basophils Absolute: 0.1 10*3/uL (ref 0.0–0.2)
Basos: 1 %
EOS (ABSOLUTE): 0.1 10*3/uL (ref 0.0–0.4)
Eos: 1 %
Hematocrit: 42.4 % (ref 34.0–46.6)
Hemoglobin: 14.1 g/dL (ref 11.1–15.9)
Immature Grans (Abs): 0 10*3/uL (ref 0.0–0.1)
Immature Granulocytes: 1 %
Lymphocytes Absolute: 2 10*3/uL (ref 0.7–3.1)
Lymphs: 25 %
MCH: 30.5 pg (ref 26.6–33.0)
MCHC: 33.3 g/dL (ref 31.5–35.7)
MCV: 92 fL (ref 79–97)
Monocytes Absolute: 0.6 10*3/uL (ref 0.1–0.9)
Monocytes: 8 %
Neutrophils Absolute: 5.3 10*3/uL (ref 1.4–7.0)
Neutrophils: 64 %
Platelets: 252 10*3/uL (ref 150–450)
RBC: 4.63 x10E6/uL (ref 3.77–5.28)
RDW: 13.1 % (ref 11.7–15.4)
WBC: 8.1 10*3/uL (ref 3.4–10.8)

## 2021-01-23 LAB — CMP14+EGFR
ALT: 21 IU/L (ref 0–32)
AST: 31 IU/L (ref 0–40)
Albumin/Globulin Ratio: 2.8 — ABNORMAL HIGH (ref 1.2–2.2)
Albumin: 5.1 g/dL — ABNORMAL HIGH (ref 3.7–4.7)
Alkaline Phosphatase: 51 IU/L (ref 44–121)
BUN/Creatinine Ratio: 15 (ref 12–28)
BUN: 13 mg/dL (ref 8–27)
Bilirubin Total: 0.5 mg/dL (ref 0.0–1.2)
CO2: 22 mmol/L (ref 20–29)
Calcium: 10.3 mg/dL (ref 8.7–10.3)
Chloride: 101 mmol/L (ref 96–106)
Creatinine, Ser: 0.85 mg/dL (ref 0.57–1.00)
Globulin, Total: 1.8 g/dL (ref 1.5–4.5)
Glucose: 99 mg/dL (ref 65–99)
Potassium: 4.6 mmol/L (ref 3.5–5.2)
Sodium: 139 mmol/L (ref 134–144)
Total Protein: 6.9 g/dL (ref 6.0–8.5)
eGFR: 73 mL/min/{1.73_m2} (ref >59)

## 2021-01-23 LAB — LIPID PANEL
Chol/HDL Ratio: 3.1 ratio (ref 0.0–4.4)
Cholesterol, Total: 148 mg/dL (ref 100–199)
HDL: 47 mg/dL (ref >39)
LDL Chol Calc (NIH): 78 mg/dL (ref 0–99)
Triglycerides: 129 mg/dL (ref 0–149)
VLDL Cholesterol Cal: 23 mg/dL (ref 5–40)

## 2021-02-20 ENCOUNTER — Telehealth: Payer: Self-pay | Admitting: Nurse Practitioner

## 2021-02-20 NOTE — Telephone Encounter (Signed)
Spoke to patient to schedule Medicare Annual Wellness Visit (AWV)  Declined wanted call back in 2023  Last AWV ;09/25/2018  please schedule at anytime with health coach  This should be a 45 minute visit.

## 2021-03-09 ENCOUNTER — Telehealth: Payer: Self-pay | Admitting: Nurse Practitioner

## 2021-03-09 NOTE — Telephone Encounter (Signed)
REFERRAL REQUEST Telephone Note  Have you been seen at our office for this problem? No, screening (Advise that they may need an appointment with their PCP before a referral can be done)  Reason for Referral:  Colonoscopy - states suppose to have another in 3 years and it is time. Referral discussed with patient:  Best contact number of patient for referral team:  [phone]     Has patient been seen by a specialist for this issue before: yes  Patient provider preference for referral:  [facility] (last one was done in [place]) Patient location preference for referral:    Patient notified that referrals can take up to a week or longer to process. If they haven't heard anything within a week they should call back and speak with the referral department.

## 2021-03-13 ENCOUNTER — Other Ambulatory Visit: Payer: Self-pay

## 2021-03-13 DIAGNOSIS — Z1211 Encounter for screening for malignant neoplasm of colon: Secondary | ICD-10-CM

## 2021-03-13 NOTE — Telephone Encounter (Signed)
Referral for colonoscopy- where she went previously

## 2021-03-13 NOTE — Telephone Encounter (Signed)
Orders placed for referral for Dr Carlean Purl with [facility]

## 2021-03-22 ENCOUNTER — Encounter: Payer: Self-pay | Admitting: Internal Medicine

## 2021-03-28 ENCOUNTER — Other Ambulatory Visit: Payer: Self-pay

## 2021-03-28 ENCOUNTER — Ambulatory Visit (INDEPENDENT_AMBULATORY_CARE_PROVIDER_SITE_OTHER): Payer: Medicare PPO | Admitting: *Deleted

## 2021-03-28 DIAGNOSIS — M810 Age-related osteoporosis without current pathological fracture: Secondary | ICD-10-CM

## 2021-03-28 MED ORDER — DENOSUMAB 60 MG/ML ~~LOC~~ SOSY
60.0000 mg | PREFILLED_SYRINGE | Freq: Once | SUBCUTANEOUS | Status: AC
  Administered 2021-03-28: 60 mg via SUBCUTANEOUS

## 2021-03-28 NOTE — Progress Notes (Signed)
Tolerated Prolia injection with no side effects

## 2021-05-08 ENCOUNTER — Ambulatory Visit (AMBULATORY_SURGERY_CENTER): Payer: Medicare PPO | Admitting: *Deleted

## 2021-05-08 ENCOUNTER — Other Ambulatory Visit: Payer: Self-pay

## 2021-05-08 VITALS — Ht 63.0 in | Wt 155.0 lb

## 2021-05-08 DIAGNOSIS — Z86010 Personal history of colonic polyps: Secondary | ICD-10-CM

## 2021-05-08 NOTE — Progress Notes (Signed)

## 2021-05-15 ENCOUNTER — Encounter: Payer: Medicare PPO | Admitting: Internal Medicine

## 2021-06-11 ENCOUNTER — Encounter: Payer: Self-pay | Admitting: Internal Medicine

## 2021-06-14 ENCOUNTER — Ambulatory Visit (AMBULATORY_SURGERY_CENTER): Payer: Medicare PPO | Admitting: Internal Medicine

## 2021-06-14 ENCOUNTER — Encounter (HOSPITAL_COMMUNITY): Payer: Self-pay

## 2021-06-14 ENCOUNTER — Encounter: Payer: Self-pay | Admitting: Internal Medicine

## 2021-06-14 VITALS — BP 129/72 | HR 70 | Temp 96.6°F | Resp 16 | Ht 63.0 in | Wt 155.0 lb

## 2021-06-14 DIAGNOSIS — Z86010 Personal history of colonic polyps: Secondary | ICD-10-CM

## 2021-06-14 DIAGNOSIS — D127 Benign neoplasm of rectosigmoid junction: Secondary | ICD-10-CM | POA: Diagnosis not present

## 2021-06-14 DIAGNOSIS — E669 Obesity, unspecified: Secondary | ICD-10-CM | POA: Diagnosis not present

## 2021-06-14 DIAGNOSIS — K635 Polyp of colon: Secondary | ICD-10-CM | POA: Diagnosis not present

## 2021-06-14 DIAGNOSIS — I10 Essential (primary) hypertension: Secondary | ICD-10-CM | POA: Diagnosis not present

## 2021-06-14 DIAGNOSIS — E785 Hyperlipidemia, unspecified: Secondary | ICD-10-CM | POA: Diagnosis not present

## 2021-06-14 MED ORDER — SODIUM CHLORIDE 0.9 % IV SOLN
500.0000 mL | Freq: Once | INTRAVENOUS | Status: DC
Start: 2021-06-14 — End: 2021-06-14

## 2021-06-14 NOTE — Progress Notes (Signed)
Called to room to assist during endoscopic procedure.  Patient ID and intended procedure confirmed with present staff. Received instructions for my participation in the procedure from the performing physician.  

## 2021-06-14 NOTE — Progress Notes (Signed)
DT -VS  Pt's states no medical or surgical changes since previsit or office visit.

## 2021-06-14 NOTE — Progress Notes (Signed)
Attempted to reach patient regarding LCS. Unable to reach patient, detailed VM left asking that the patient return my call. °

## 2021-06-14 NOTE — Op Note (Signed)
[facility] Patient Name: Lauren Dunn Procedure Date: 06/14/2021 10:21 AM MRN: [id number] Endoscopist: Gatha Mayer , MD Age: 71 Referring MD:  Date of Birth: Dec 26, 1950 Gender: Female Account #: [id number] Procedure:                Colonoscopy Indications:              Surveillance: Personal history of adenomatous                            polyps on last colonoscopy > 3 years ago, Last                            colonoscopy: 2019 Medicines:                Propofol per Anesthesia, Monitored Anesthesia Care Procedure:                Pre-Anesthesia Assessment:                           - Prior to the procedure, a History and Physical                            was performed, and patient medications and                            allergies were reviewed. The patient's tolerance of                            previous anesthesia was also reviewed. The risks                            and benefits of the procedure and the sedation                            options and risks were discussed with the patient.                            All questions were answered, and informed consent                            was obtained. Prior Anticoagulants: The patient has                            taken no previous anticoagulant or antiplatelet                            agents. ASA Grade Assessment: II - A patient with                            mild systemic disease. After reviewing the risks                            and benefits, the patient was deemed in  satisfactory condition to undergo the procedure.                           After obtaining informed consent, the colonoscope                            was passed under direct vision. Throughout the                            procedure, the patient's blood pressure, pulse, and                            oxygen saturations were monitored continuously. The                            Olympus CF-HQ190L  [id number]) Colonoscope was                            introduced through the anus and advanced to the the                            cecum, identified by appendiceal orifice and                            ileocecal valve. The colonoscopy was performed                            without difficulty. The patient tolerated the                            procedure well. The quality of the bowel                            preparation was excellent. The ileocecal valve,                            appendiceal orifice, and rectum were photographed. Scope In: 10:36:18 AM Scope Out: 10:56:07 AM Scope Withdrawal Time: 0 hours 14 minutes 24 seconds  Total Procedure Duration: 0 hours 19 minutes 49 seconds  Findings:                 The perianal and digital rectal examinations were                            normal.                           A diminutive polyp was found in the recto-sigmoid                            colon. The polyp was sessile. The polyp was removed                            with a cold snare. Resection and retrieval were  complete. Verification of patient identification                            for the specimen was done. Estimated blood loss was                            minimal.                           The exam was otherwise without abnormality on                            direct and retroflexion views. Complications:            No immediate complications. Estimated Blood Loss:     Estimated blood loss was minimal. Impression:               - One diminutive polyp at the recto-sigmoid colon,                            removed with a cold snare. Resected and retrieved.                           - The examination was otherwise normal on direct                            and retroflexion views.                           - Personal history of colonic polyps. 9 mm tubular                            adenoma removed 01/2012 -                            02/18/2018 4 diminutive polyps removed 3 adenomas                            and 1 ssp Recommendation:           - Patient has a contact number available for                            emergencies. The signs and symptoms of potential                            delayed complications were discussed with the                            patient. Return to normal activities tomorrow.                            Written discharge instructions were provided to the                            patient.                           -  Resume previous diet.                           - Continue present medications.                           - Repeat colonoscopy is recommended for                            surveillance. The colonoscopy date will be                            determined after pathology results from today's                            exam become available for review. Gatha Mayer, MD 06/14/2021 11:02:56 AM This report has been signed electronically.

## 2021-06-14 NOTE — Patient Instructions (Addendum)
I found and removed one tiny polyp. I am thinking you should consider repeating a colonoscopy in 5 years if healthy then. I will let you know final recommendations.  I appreciate the opportunity to care for you. Gatha Mayer, MD, FACG YOU HAD AN ENDOSCOPIC PROCEDURE TODAY AT [facility]:   Refer to the procedure report that was given to you for any specific questions about what was found during the examination.  If the procedure report does not answer your questions, please call your gastroenterologist to clarify.  If you requested that your care partner not be given the details of your procedure findings, then the procedure report has been included in a sealed envelope for you to review at your convenience later.  YOU SHOULD EXPECT: Some feelings of bloating in the abdomen. Passage of more gas than usual.  Walking can help get rid of the air that was put into your GI tract during the procedure and reduce the bloating. If you had a lower endoscopy (such as a colonoscopy or flexible sigmoidoscopy) you may notice spotting of blood in your stool or on the toilet paper. If you underwent a bowel prep for your procedure, you may not have a normal bowel movement for a few days.  Please Note:  You might notice some irritation and congestion in your nose or some drainage.  This is from the oxygen used during your procedure.  There is no need for concern and it should clear up in a day or so.  SYMPTOMS TO REPORT IMMEDIATELY:  Following lower endoscopy (colonoscopy or flexible sigmoidoscopy):  Excessive amounts of blood in the stool  Significant tenderness or worsening of abdominal pains  Swelling of the abdomen that is new, acute  Fever of 100F or higher  For urgent or emergent issues, a gastroenterologist can be reached at any hour by calling [phone]. Do not use MyChart messaging for urgent concerns.    DIET:  We do recommend a small meal at first, but then you may proceed  to your regular diet.  Drink plenty of fluids but you should avoid alcoholic beverages for 24 hours.  ACTIVITY:  You should plan to take it easy for the rest of today and you should NOT DRIVE or use heavy machinery until tomorrow (because of the sedation medicines used during the test).    FOLLOW UP: Our staff will call the number listed on your records 48-72 hours following your procedure to check on you and address any questions or concerns that you may have regarding the information given to you following your procedure. If we do not reach you, we will leave a message.  We will attempt to reach you two times.  During this call, we will ask if you have developed any symptoms of COVID 19. If you develop any symptoms (ie: fever, flu-like symptoms, shortness of breath, cough etc.) before then, please call [phone].  If you test positive for Covid 19 in the 2 weeks post procedure, please call and report this information to [place].    If any biopsies were taken you will be contacted by phone or by letter within the next 1-3 weeks.  Please call us at [phone] if you have not heard about the biopsies in 3 weeks.    SIGNATURES/CONFIDENTIALITY: You and/or your care partner have signed paperwork which will be entered into your electronic medical record.  These signatures attest to the fact that that the information above on your After Visit  Summary has been reviewed and is understood.  Full responsibility of the confidentiality of this discharge information lies with you and/or your care-partner.

## 2021-06-14 NOTE — Progress Notes (Signed)
[facility] History and Physical   Primary Care Physician:  Chevis Pretty, FNP   Reason for Procedure:   Hx colon polyps  Plan:    colonoscopy     HPI: Lauren Dunn is a 71 y.o. female w/ hx colon polyps  9 mm tubular adenoma removed 01/2012 - 02/18/2018 4 diminutive polyps removed 3 adenomas and 1 ssp Past Medical History:  Diagnosis Date   Allergy    seasonal   Anemia    past hx 25-30 yrs ago   Hyperlipidemia    Hypertension    Osteoporosis    Prolia Q 6 months   Personal history of colonic polyps 02/11/2012    Past Surgical History:  Procedure Laterality Date   COLONOSCOPY  2019   2013, 2019   POLYPECTOMY     2019 last colon TA   WISDOM TOOTH EXTRACTION      Prior to Admission medications   Medication Sig Start Date End Date Taking? Authorizing Provider  cetirizine (ZYRTEC) 10 MG tablet Take 10 mg by mouth daily.   Yes [provider]  lisinopril-hydrochlorothiazide (ZESTORETIC) 20-12.5 MG tablet Take 1 tablet by mouth daily. 01/22/21  Yes Martin, Mary-Margaret, FNP  Ascorbic Acid (VITAMIN C PO) Take by mouth.    [provider]  BIOTIN PO Take by mouth.    [provider]  CALCIUM PO Take by mouth.    [provider]  Cholecalciferol (VITAMIN D3 PO) Take by mouth.    [provider]  Clobetasol Prop Emollient Base (CLOBETASOL PROPIONATE E) 0.05 % emollient cream Apply to affected area bid 12/02/19   Chevis Pretty, FNP  denosumab (PROLIA) 60 MG/ML SOSY injection Inject 60 mg into the skin every 6 (six) months.    [provider]  Multiple Vitamin (MULTIVITAMIN) capsule Take 1 capsule by mouth daily.    [provider]  rosuvastatin (CRESTOR) 10 MG tablet Take 1 tablet (10 mg total) by mouth daily. 01/22/21   Chevis Pretty, FNP    Current Outpatient Medications  Medication Sig Dispense Refill   cetirizine (ZYRTEC) 10 MG tablet Take 10 mg by mouth daily.      lisinopril-hydrochlorothiazide (ZESTORETIC) 20-12.5 MG tablet Take 1 tablet by mouth daily. 90 tablet 1   Ascorbic Acid (VITAMIN C PO) Take by mouth.     BIOTIN PO Take by mouth.     CALCIUM PO Take by mouth.     Cholecalciferol (VITAMIN D3 PO) Take by mouth.     Clobetasol Prop Emollient Base (CLOBETASOL PROPIONATE E) 0.05 % emollient cream Apply to affected area bid 60 g 2   denosumab (PROLIA) 60 MG/ML SOSY injection Inject 60 mg into the skin every 6 (six) months.     Multiple Vitamin (MULTIVITAMIN) capsule Take 1 capsule by mouth daily.     rosuvastatin (CRESTOR) 10 MG tablet Take 1 tablet (10 mg total) by mouth daily. 90 tablet 1   Current Facility-Administered Medications  Medication Dose Route Frequency Provider Last Rate Last Admin   0.9 %  sodium chloride infusion  500 mL Intravenous Once Gatha Mayer, MD        Allergies as of 06/14/2021 - Review Complete 06/14/2021  Allergen Reaction Noted   Lipitor [atorvastatin]  07/03/2012   Elemental sulfur  01/21/2012    Family History  Problem Relation Age of Onset   Stroke Mother    Hypertension Mother    Hypertension Sister    Hypertension Sister    Diabetes Sister  Diabetes Sister    Diabetes Brother    Diabetes Nephew    Colon cancer Neg Hx    Esophageal cancer Neg Hx    Stomach cancer Neg Hx    Rectal cancer Neg Hx    Colon polyps Neg Hx     Social History   Socioeconomic History   Marital status: Single    Spouse name: Not on file   Number of children: 1   Years of education: Not on file   Highest education level: Associate degree: occupational, Hotel manager, or vocational program  Occupational History   Occupation: Retired    Comment: 20 yrs at [employer]  Tobacco Use   Smoking status: Every Day    Packs/day: 1.00    Types: Cigarettes   Smokeless tobacco: Never  Vaping Use   Vaping Use: Never used  Substance and Sexual Activity   Alcohol use: No   Drug use: No   Sexual activity: Not Currently   Review of  Systems:  other review of systems negative except as mentioned in the HPI.  Physical Exam: Vital signs BP (!) 160/90    Pulse 78    Temp (!) 96.6 F (35.9 C)    Resp 13    Ht 5\' 3"  (1.6 m)    Wt 155 lb (70.3 kg)    SpO2 99%    BMI 27.46 kg/m   General:   Alert,  Well-developed, well-nourished, pleasant and cooperative in NAD Lungs:  Clear throughout to auscultation.   Heart:  Regular rate and rhythm; no murmurs, clicks, rubs,  or gallops. Abdomen:  Soft, nontender and nondistended. Normal bowel sounds.   Neuro/Psych:  Alert and cooperative. Normal mood and affect. A and O x 3   @Herman Mell  Simonne Maffucci, MD, [facility] [phone] (pager) 06/14/2021 10:25 AM@

## 2021-06-14 NOTE — Progress Notes (Signed)
Report to PACU, RN, vss, BBS= Clear.  

## 2021-06-18 ENCOUNTER — Telehealth: Payer: Self-pay

## 2021-06-18 NOTE — Telephone Encounter (Signed)
°  Follow up Call-  Call back number 06/14/2021  Post procedure Call Back phone  # [phone] home  Permission to leave phone message Yes  Some recent data might be hidden     Patient questions:  Do you have a fever, pain , or abdominal swelling? No. Pain Score  0 *  Have you tolerated food without any problems? Yes.    Have you been able to return to your normal activities? Yes.    Do you have any questions about your discharge instructions: Diet   No. Medications  No. Follow up visit  No.  Do you have questions or concerns about your Care? No.  Actions: * If pain score is 4 or above: No action needed, pain <4.  Have you developed a fever since your procedure? no  2.   Have you had an respiratory symptoms (SOB or cough) since your procedure? no  3.   Have you tested positive for COVID 19 since your procedure no  4.   Have you had any family members/close contacts diagnosed with the COVID 19 since your procedure?  no   If yes to any of these questions please route to Joylene John, RN and Joella Prince, RN

## 2021-06-19 ENCOUNTER — Encounter: Payer: Self-pay | Admitting: Internal Medicine

## 2021-07-02 ENCOUNTER — Encounter (HOSPITAL_COMMUNITY): Payer: Self-pay

## 2021-07-02 NOTE — Progress Notes (Signed)
Attempted to contact patient regarding follow-up LDCT. Unable to reach patient, detailed VM left asking that the patient return my call.

## 2021-07-19 ENCOUNTER — Other Ambulatory Visit: Payer: Self-pay | Admitting: Nurse Practitioner

## 2021-07-19 DIAGNOSIS — E785 Hyperlipidemia, unspecified: Secondary | ICD-10-CM

## 2021-07-19 DIAGNOSIS — I10 Essential (primary) hypertension: Secondary | ICD-10-CM

## 2021-07-30 ENCOUNTER — Ambulatory Visit: Payer: Medicare PPO | Admitting: Nurse Practitioner

## 2021-07-30 ENCOUNTER — Encounter: Payer: Self-pay | Admitting: Nurse Practitioner

## 2021-07-30 ENCOUNTER — Ambulatory Visit (INDEPENDENT_AMBULATORY_CARE_PROVIDER_SITE_OTHER): Payer: Medicare PPO

## 2021-07-30 VITALS — BP 131/81 | HR 80 | Temp 97.1°F | Resp 20 | Ht 63.0 in | Wt 152.0 lb

## 2021-07-30 DIAGNOSIS — M810 Age-related osteoporosis without current pathological fracture: Secondary | ICD-10-CM

## 2021-07-30 DIAGNOSIS — F17200 Nicotine dependence, unspecified, uncomplicated: Secondary | ICD-10-CM | POA: Diagnosis not present

## 2021-07-30 DIAGNOSIS — M542 Cervicalgia: Secondary | ICD-10-CM | POA: Diagnosis not present

## 2021-07-30 DIAGNOSIS — M8589 Other specified disorders of bone density and structure, multiple sites: Secondary | ICD-10-CM | POA: Diagnosis not present

## 2021-07-30 DIAGNOSIS — I10 Essential (primary) hypertension: Secondary | ICD-10-CM | POA: Diagnosis not present

## 2021-07-30 DIAGNOSIS — Z6828 Body mass index (BMI) 28.0-28.9, adult: Secondary | ICD-10-CM

## 2021-07-30 DIAGNOSIS — E785 Hyperlipidemia, unspecified: Secondary | ICD-10-CM

## 2021-07-30 DIAGNOSIS — Z780 Asymptomatic menopausal state: Secondary | ICD-10-CM | POA: Diagnosis not present

## 2021-07-30 MED ORDER — CLOBETASOL PROP EMOLLIENT BASE 0.05 % EX CREA: TOPICAL_CREAM | CUTANEOUS | 2 refills | Status: AC

## 2021-07-30 MED ORDER — ROSUVASTATIN CALCIUM 10 MG PO TABS: 10.0000 mg | ORAL_TABLET | Freq: Every day | ORAL | 1 refills | Status: DC

## 2021-07-30 MED ORDER — LISINOPRIL-HYDROCHLOROTHIAZIDE 20-12.5 MG PO TABS: 1.0000 | ORAL_TABLET | Freq: Every day | ORAL | 1 refills | Status: DC

## 2021-07-30 MED ORDER — CYCLOBENZAPRINE HCL 5 MG PO TABS: 5.0000 mg | ORAL_TABLET | Freq: Three times a day (TID) | ORAL | 1 refills | Status: DC | PRN

## 2021-07-30 MED ORDER — DENOSUMAB 60 MG/ML ~~LOC~~ SOSY: 60.0000 mg | PREFILLED_SYRINGE | SUBCUTANEOUS | 1 refills | Status: DC

## 2021-07-30 NOTE — Progress Notes (Signed)
? ?Subjective:  ? ? Patient ID: Lauren Dunn, female    DOB: [birth year], 71 y.o.   MRN: [id number] ? ? ?Chief Complaint: Medical Management of Chronic Issues (Neck and back pain/) ?  ? ?HPI: ? ?Lauren Dunn is a 71 y.o. who identifies as a female who was assigned female at birth.  ? ?Social history: ?Lives with: her self ?Work history: retired from [employer] ? ? ?Comes in today for follow up of the following chronic medical issues: ? ?1. Primary hypertension ?No c/o chest pain, sob or headache. Doe snot check blood pressure at home. ?BP Readings from Last 3 Encounters:  ?07/30/21 131/81  ?06/14/21 129/72  ?01/22/21 136/77  ? ? ? ?2. Hyperlipidemia with target LDL less than 100 ?Does try to watch diet but does no dedicated exercise. ?Lab Results  ?Component Value Date  ? CHOL 148 01/22/2021  ? HDL 47 01/22/2021  ? [place] 78 01/22/2021  ? TRIG 129 01/22/2021  ? CHOLHDL 3.1 01/22/2021  ? ? ? ?3. Age-related osteoporosis without current pathological fracture ?Does not do any dedicated weight bearing exercise. Last dexascan was don 07/19/19 with t score of -2.2 ? ?4. Smoker ?Smokes over a pack a day. Her last low dose CT scan was done 03/03/20. ? ?5. BMI 28.0-28.9,adult ?Weight is down 3 lbs ?Wt Readings from Last 3 Encounters:  ?07/30/21 152 lb (68.9 kg)  ?06/14/21 155 lb (70.3 kg)  ?05/08/21 155 lb (70.3 kg)  ? ?BMI Readings from Last 3 Encounters:  ?07/30/21 26.93 kg/m?  ?06/14/21 27.46 kg/m?  ?05/08/21 27.46 kg/m?  ? ? ? ? ?New complaints: ?Having neck and back pain- rate pain 5/10. It is worse in middle of back and her neck. She has been taking flexeril occasionally and that helps. ? ?Allergies  ?Allergen Reactions  ? Lipitor [Atorvastatin]   ?  myalgia  ? Elemental Sulfur   ?  Eye drop--- Inflammation to eyes  ? ?Outpatient Encounter Medications as of 07/30/2021  ?Medication Sig  ? Ascorbic Acid (VITAMIN C PO) Take by mouth.  ? BIOTIN PO Take by mouth.  ? CALCIUM PO Take by mouth.  ? cetirizine (ZYRTEC) 10 MG tablet Take 10  mg by mouth daily.  ? Cholecalciferol (VITAMIN D3 PO) Take by mouth.  ? Clobetasol Prop Emollient Base (CLOBETASOL PROPIONATE E) 0.05 % emollient cream Apply to affected area bid  ? denosumab (PROLIA) 60 MG/ML SOSY injection Inject 60 mg into the skin every 6 (six) months.  ? lisinopril-hydrochlorothiazide (ZESTORETIC) 20-12.5 MG tablet Take 1 tablet by mouth once daily  ? Multiple Vitamin (MULTIVITAMIN) capsule Take 1 capsule by mouth daily.  ? rosuvastatin (CRESTOR) 10 MG tablet Take 1 tablet by mouth once daily  ? ?No facility-administered encounter medications on file as of 07/30/2021.  ? ? ?Past Surgical History:  ?Procedure Laterality Date  ? COLONOSCOPY  2019  ? 2013, 2019  ? POLYPECTOMY    ? 2019 last colon TA  ? WISDOM TOOTH EXTRACTION    ? ? ?Family History  ?Problem Relation Age of Onset  ? Stroke Mother   ? Hypertension Mother   ? Hypertension Sister   ? Hypertension Sister   ? Diabetes Sister   ? Diabetes Sister   ? Diabetes Brother   ? Diabetes Nephew   ? Colon cancer Neg Hx   ? Esophageal cancer Neg Hx   ? Stomach cancer Neg Hx   ? Rectal cancer Neg Hx   ? Colon polyps Neg Hx   ? ? ? ? ?  Controlled substance contract: n/a ? ? ? ? ?Review of Systems  ?Constitutional:  Negative for diaphoresis.  ?Eyes:  Negative for pain.  ?Respiratory:  Negative for shortness of breath.   ?Cardiovascular:  Negative for chest pain, palpitations and leg swelling.  ?Gastrointestinal:  Negative for abdominal pain.  ?Endocrine: Negative for polydipsia.  ?Skin:  Negative for rash.  ?Neurological:  Negative for dizziness, weakness and headaches.  ?Hematological:  Does not bruise/bleed easily.  ?All other systems reviewed and are negative. ? ?   ?Objective:  ? Physical Exam ?Vitals and nursing note reviewed.  ?Constitutional:   ?   General: She is not in acute distress. ?   Appearance: Normal appearance. She is well-developed.  ?HENT:  ?   Head: Normocephalic.  ?   Right Ear: Tympanic membrane normal.  ?   Left Ear: Tympanic  membrane normal.  ?   Nose: Nose normal.  ?   Mouth/Throat:  ?   Mouth: Mucous membranes are moist.  ?Eyes:  ?   Pupils: Pupils are equal, round, and reactive to light.  ?Neck:  ?   Vascular: No carotid bruit or JVD.  ?Cardiovascular:  ?   Rate and Rhythm: Normal rate and regular rhythm.  ?   Heart sounds: Normal heart sounds.  ?Pulmonary:  ?   Effort: Pulmonary effort is normal. No respiratory distress.  ?   Breath sounds: Normal breath sounds. No wheezing or rales.  ?Chest:  ?   Chest wall: No tenderness.  ?Abdominal:  ?   General: Bowel sounds are normal. There is no distension or abdominal bruit.  ?   Palpations: Abdomen is soft. There is no hepatomegaly, splenomegaly, mass or pulsatile mass.  ?   Tenderness: There is no abdominal tenderness.  ?Musculoskeletal:     ?   General: Normal range of motion.  ?   Cervical back: Normal range of motion and neck supple.  ?Lymphadenopathy:  ?   Cervical: No cervical adenopathy.  ?Skin: ?   General: Skin is warm and dry.  ?Neurological:  ?   Mental Status: She is alert and oriented to person, place, and time.  ?   Deep Tendon Reflexes: Reflexes are normal and symmetric.  ?Psychiatric:     ?   Behavior: Behavior normal.     ?   Thought Content: Thought content normal.     ?   Judgment: Judgment normal.  ? ? ?BP 131/81   Pulse 80   Temp (!) 97.1 ?F (36.2 ?C) (Temporal)   Resp 20   Ht '5\' 3"'  (1.6 m)   Wt 152 lb (68.9 kg)   SpO2 100%   BMI 26.93 kg/m?  ? ? ? ?   ?Assessment & Plan:  ?Lauren Dunn comes in today with chief complaint of Medical Management of Chronic Issues (Neck and back pain/) ? ? ?Diagnosis and orders addressed: ? ?1. Primary hypertension ?Low sodium diet ?- lisinopril-hydrochlorothiazide (ZESTORETIC) 20-12.5 MG tablet; Take 1 tablet by mouth daily.  Dispense: 90 tablet; Refill: 1 ?- CBC with Differential/Platelet ?- CMP14+EGFR ? ?2. Hyperlipidemia with target LDL less than 100 ?Low fat diet ?- rosuvastatin (CRESTOR) 10 MG tablet; Take 1 tablet (10 mg  total) by mouth daily.  Dispense: 90 tablet; Refill: 1 ?- Lipid panel ? ?3. Age-related osteoporosis without current pathological fracture ?Weiht bearing exercises ?- denosumab (PROLIA) 60 MG/ML SOSY injection; Inject 60 mg into the skin every 6 (six) months.  Dispense: 180 mL; Refill: 1 ?- DG [facility]  DEXA ? ?4. Smoker ?Smoking cessation encouraged ?- CT CHEST LUNG CA SCREEN LOW DOSE W/O CM; Future ? ?5. BMI 28.0-28.9,adult ?Discussed diet and exercise for person with BMI >25 ?Will recheck weight in 3-6 months ? ? ?6. Cervical muscle pain ?Flexeril 8m 1 po TID prn- sedation precautions ?Moist heat ? ? ? ?Labs pending ?Health Maintenance reviewed ?Diet and exercise encouraged ? ?Follow up plan: ?6 months ? ? ?Mary-Margaret MHassell Done FNP ? ? ?

## 2021-07-30 NOTE — Patient Instructions (Signed)
Exercising to Stay Healthy °To become healthy and stay healthy, it is recommended that you do moderate-intensity and vigorous-intensity exercise. You can tell that you are exercising at a moderate intensity if your heart starts beating faster and you start breathing faster but can still hold a conversation. You can tell that you are exercising at a vigorous intensity if you are breathing much harder and faster and cannot hold a conversation while exercising. °How can exercise benefit me? °Exercising regularly is important. It has many health benefits, such as: °Improving overall fitness, flexibility, and endurance. °Increasing bone density. °Helping with weight control. °Decreasing body fat. °Increasing muscle strength and endurance. °Reducing stress and tension, anxiety, depression, or anger. °Improving overall health. °What guidelines should I follow while exercising? °Before you start a new exercise program, talk with your health care provider. °Do not exercise so much that you hurt yourself, feel dizzy, or get very short of breath. °Wear comfortable clothes and wear shoes with good support. °Drink plenty of water while you exercise to prevent dehydration or heat stroke. °Work out until your breathing and your heartbeat get faster (moderate intensity). °How often should I exercise? °Choose an activity that you enjoy, and set realistic goals. Your health care provider can help you make an activity plan that is individually designed and works best for you. °Exercise regularly as told by your health care provider. This may include: °Doing strength training two times a week, such as: °Lifting weights. °Using resistance bands. °Push-ups. °Sit-ups. °Yoga. °Doing a certain intensity of exercise for a given amount of time. Choose from these options: °A total of 150 minutes of moderate-intensity exercise every week. °A total of 75 minutes of vigorous-intensity exercise every week. °A mix of moderate-intensity and  vigorous-intensity exercise every week. °Children, pregnant women, people who have not exercised regularly, people who are overweight, and older adults may need to talk with a health care provider about what activities are safe to perform. If you have a medical condition, be sure to talk with your health care provider before you start a new exercise program. °What are some exercise ideas? °Moderate-intensity exercise ideas include: °Walking 1 mile (1.6 km) in about 15 minutes. °Biking. °Hiking. °Golfing. °Dancing. °Water aerobics. °Vigorous-intensity exercise ideas include: °Walking 4.5 miles (7.2 km) or more in about 1 hour. °Jogging or running 5 miles (8 km) in about 1 hour. °Biking 10 miles (16.1 km) or more in about 1 hour. °Lap swimming. °Roller-skating or in-line skating. °Cross-country skiing. °Vigorous competitive sports, such as football, basketball, and soccer. °Jumping rope. °Aerobic dancing. °What are some everyday activities that can help me get exercise? °Yard work, such as: °Pushing a lawn mower. °Raking and bagging leaves. °Washing your car. °Pushing a stroller. °Shoveling snow. °Gardening. °Washing windows or floors. °How can I be more active in my day-to-day activities? °Use stairs instead of an elevator. °Take a walk during your lunch break. °If you drive, park your car farther away from your work or school. °If you take public transportation, get off one stop early and walk the rest of the way. °Stand up or walk around during all of your indoor phone calls. °Get up, stretch, and walk around every 30 minutes throughout the day. °Enjoy exercise with a friend. Support to continue exercising will help you keep a regular routine of activity. °Where to find more information °You can find more information about exercising to stay healthy from: °U[place] Department of Health and Human Services: www.hhs.gov °Centers for Disease Control and Prevention (  CDC): www.cdc.gov °Summary °Exercising regularly is  important. It will improve your overall fitness, flexibility, and endurance. °Regular exercise will also improve your overall health. It can help you control your weight, reduce stress, and improve your bone density. °Do not exercise so much that you hurt yourself, feel dizzy, or get very short of breath. °Before you start a new exercise program, talk with your health care provider. °This information is not intended to replace advice given to you by your health care provider. Make sure you discuss any questions you have with your health care provider. °Document Revised: 09/08/2020 Document Reviewed: 09/08/2020 °Elsevier Patient Education © 2022 Elsevier Inc. ° °

## 2021-07-31 LAB — LIPID PANEL
Chol/HDL Ratio: 2.9 ratio (ref 0.0–4.4)
Cholesterol, Total: 135 mg/dL (ref 100–199)
HDL: 46 mg/dL (ref >39)
LDL Chol Calc (NIH): 64 mg/dL (ref 0–99)
Triglycerides: 143 mg/dL (ref 0–149)
VLDL Cholesterol Cal: 25 mg/dL (ref 5–40)

## 2021-07-31 LAB — CBC WITH DIFFERENTIAL/PLATELET
Basophils Absolute: 0.1 10*3/uL (ref 0.0–0.2)
Basos: 1 %
EOS (ABSOLUTE): 0.1 10*3/uL (ref 0.0–0.4)
Eos: 1 %
Hematocrit: 40.2 % (ref 34.0–46.6)
Hemoglobin: 13.9 g/dL (ref 11.1–15.9)
Immature Grans (Abs): 0 10*3/uL (ref 0.0–0.1)
Immature Granulocytes: 0 %
Lymphocytes Absolute: 1.5 10*3/uL (ref 0.7–3.1)
Lymphs: 18 %
MCH: 30.5 pg (ref 26.6–33.0)
MCHC: 34.6 g/dL (ref 31.5–35.7)
MCV: 88 fL (ref 79–97)
Monocytes Absolute: 0.6 10*3/uL (ref 0.1–0.9)
Monocytes: 8 %
Neutrophils Absolute: 6.2 10*3/uL (ref 1.4–7.0)
Neutrophils: 72 %
Platelets: 291 10*3/uL (ref 150–450)
RBC: 4.56 x10E6/uL (ref 3.77–5.28)
RDW: 12.8 % (ref 11.7–15.4)
WBC: 8.5 10*3/uL (ref 3.4–10.8)

## 2021-07-31 LAB — CMP14+EGFR
ALT: 16 IU/L (ref 0–32)
AST: 22 IU/L (ref 0–40)
Albumin/Globulin Ratio: 2.5 — ABNORMAL HIGH (ref 1.2–2.2)
Albumin: 4.9 g/dL — ABNORMAL HIGH (ref 3.7–4.7)
Alkaline Phosphatase: 48 IU/L (ref 44–121)
BUN/Creatinine Ratio: 17 (ref 12–28)
BUN: 15 mg/dL (ref 8–27)
Bilirubin Total: 0.5 mg/dL (ref 0.0–1.2)
CO2: 23 mmol/L (ref 20–29)
Calcium: 10 mg/dL (ref 8.7–10.3)
Chloride: 100 mmol/L (ref 96–106)
Creatinine, Ser: 0.87 mg/dL (ref 0.57–1.00)
Globulin, Total: 2 g/dL (ref 1.5–4.5)
Glucose: 102 mg/dL — ABNORMAL HIGH (ref 70–99)
Potassium: 4.2 mmol/L (ref 3.5–5.2)
Sodium: 138 mmol/L (ref 134–144)
Total Protein: 6.9 g/dL (ref 6.0–8.5)
eGFR: 71 mL/min/{1.73_m2} (ref >59)

## 2021-11-07 ENCOUNTER — Telehealth: Payer: Self-pay | Admitting: *Deleted

## 2021-11-07 NOTE — Telephone Encounter (Signed)
Started a PA for prolia in [facility] -   This was the response I received -   PROLIA '60MG'$ /ML SYRINGES Not Required  Will wait for rep to discuss 6/26 before ordering.   Also not sure if this is pharm or buy and bill  Was due 09/26/21.

## 2021-11-22 NOTE — Telephone Encounter (Signed)
Buy and Bill per rep - Patient owes $65

## 2021-11-29 ENCOUNTER — Encounter: Payer: Self-pay | Admitting: *Deleted

## 2021-12-31 NOTE — Telephone Encounter (Signed)
Ordered - pt aware  - we will call her back once we receive and sch her inj

## 2022-01-02 ENCOUNTER — Telehealth: Payer: Self-pay | Admitting: Nurse Practitioner

## 2022-01-02 ENCOUNTER — Ambulatory Visit (INDEPENDENT_AMBULATORY_CARE_PROVIDER_SITE_OTHER): Payer: Medicare PPO | Admitting: *Deleted

## 2022-01-02 DIAGNOSIS — M810 Age-related osteoporosis without current pathological fracture: Secondary | ICD-10-CM | POA: Diagnosis not present

## 2022-01-02 MED ORDER — DENOSUMAB 60 MG/ML ~~LOC~~ SOSY
60.0000 mg | PREFILLED_SYRINGE | Freq: Once | SUBCUTANEOUS | Status: AC
  Administered 2022-01-02: 60 mg via SUBCUTANEOUS

## 2022-01-02 NOTE — Telephone Encounter (Signed)
Pt called

## 2022-01-02 NOTE — Telephone Encounter (Signed)
Appt amde - prolia given

## 2022-01-03 ENCOUNTER — Ambulatory Visit: Payer: Medicare PPO

## 2022-01-31 ENCOUNTER — Ambulatory Visit: Payer: Medicare PPO | Admitting: Nurse Practitioner

## 2022-02-05 ENCOUNTER — Encounter: Payer: Self-pay | Admitting: Nurse Practitioner

## 2022-02-05 ENCOUNTER — Ambulatory Visit: Payer: Medicare PPO | Admitting: Nurse Practitioner

## 2022-02-05 VITALS — BP 152/78 | HR 76 | Temp 98.0°F | Resp 20 | Ht 63.0 in | Wt 152.0 lb

## 2022-02-05 DIAGNOSIS — F5101 Primary insomnia: Secondary | ICD-10-CM

## 2022-02-05 DIAGNOSIS — F17200 Nicotine dependence, unspecified, uncomplicated: Secondary | ICD-10-CM

## 2022-02-05 DIAGNOSIS — E785 Hyperlipidemia, unspecified: Secondary | ICD-10-CM | POA: Diagnosis not present

## 2022-02-05 DIAGNOSIS — I10 Essential (primary) hypertension: Secondary | ICD-10-CM

## 2022-02-05 DIAGNOSIS — Z6828 Body mass index (BMI) 28.0-28.9, adult: Secondary | ICD-10-CM

## 2022-02-05 DIAGNOSIS — M810 Age-related osteoporosis without current pathological fracture: Secondary | ICD-10-CM | POA: Diagnosis not present

## 2022-02-05 MED ORDER — LISINOPRIL-HYDROCHLOROTHIAZIDE 20-12.5 MG PO TABS: 2.0000 | ORAL_TABLET | Freq: Every day | ORAL | 1 refills | Status: DC

## 2022-02-05 MED ORDER — ZOLPIDEM TARTRATE 10 MG PO TABS: 10.0000 mg | ORAL_TABLET | Freq: Every evening | ORAL | 1 refills | Status: DC | PRN

## 2022-02-05 MED ORDER — ROSUVASTATIN CALCIUM 10 MG PO TABS: 10.0000 mg | ORAL_TABLET | Freq: Every day | ORAL | 1 refills | Status: DC

## 2022-02-05 NOTE — Patient Instructions (Signed)
Exercising to Stay Healthy To become healthy and stay healthy, it is recommended that you do moderate-intensity and vigorous-intensity exercise. You can tell that you are exercising at a moderate intensity if your heart starts beating faster and you start breathing faster but can still hold a conversation. You can tell that you are exercising at a vigorous intensity if you are breathing much harder and faster and cannot hold a conversation while exercising. How can exercise benefit me? Exercising regularly is important. It has many health benefits, such as: Improving overall fitness, flexibility, and endurance. Increasing bone density. Helping with weight control. Decreasing body fat. Increasing muscle strength and endurance. Reducing stress and tension, anxiety, depression, or anger. Improving overall health. What guidelines should I follow while exercising? Before you start a new exercise program, talk with your health care provider. Do not exercise so much that you hurt yourself, feel dizzy, or get very short of breath. Wear comfortable clothes and wear shoes with good support. Drink plenty of water while you exercise to prevent dehydration or heat stroke. Work out until your breathing and your heartbeat get faster (moderate intensity). How often should I exercise? Choose an activity that you enjoy, and set realistic goals. Your health care provider can help you make an activity plan that is individually designed and works best for you. Exercise regularly as told by your health care provider. This may include: Doing strength training two times a week, such as: Lifting weights. Using resistance bands. Push-ups. Sit-ups. Yoga. Doing a certain intensity of exercise for a given amount of time. Choose from these options: A total of 150 minutes of moderate-intensity exercise every week. A total of 75 minutes of vigorous-intensity exercise every week. A mix of moderate-intensity and  vigorous-intensity exercise every week. Children, pregnant women, people who have not exercised regularly, people who are overweight, and older adults may need to talk with a health care provider about what activities are safe to perform. If you have a medical condition, be sure to talk with your health care provider before you start a new exercise program. What are some exercise ideas? Moderate-intensity exercise ideas include: Walking 1 mile (1.6 km) in about 15 minutes. Biking. Hiking. Golfing. Dancing. Water aerobics. Vigorous-intensity exercise ideas include: Walking 4.5 miles (7.2 km) or more in about 1 hour. Jogging or running 5 miles (8 km) in about 1 hour. Biking 10 miles (16.1 km) or more in about 1 hour. Lap swimming. Roller-skating or in-line skating. Cross-country skiing. Vigorous competitive sports, such as football, basketball, and soccer. Jumping rope. Aerobic dancing. What are some everyday activities that can help me get exercise? Yard work, such as: Pushing a lawn mower. Raking and bagging leaves. Washing your car. Pushing a stroller. Shoveling snow. Gardening. Washing windows or floors. How can I be more active in my day-to-day activities? Use stairs instead of an elevator. Take a walk during your lunch break. If you drive, park your car farther away from your work or school. If you take public transportation, get off one stop early and walk the rest of the way. Stand up or walk around during all of your indoor phone calls. Get up, stretch, and walk around every 30 minutes throughout the day. Enjoy exercise with a friend. Support to continue exercising will help you keep a regular routine of activity. Where to find more information You can find more information about exercising to stay healthy from: U.S. Department of Health and Human Services: www.hhs.gov Centers for Disease Control and Prevention (  CDC): www.cdc.gov Summary Exercising regularly is  important. It will improve your overall fitness, flexibility, and endurance. Regular exercise will also improve your overall health. It can help you control your weight, reduce stress, and improve your bone density. Do not exercise so much that you hurt yourself, feel dizzy, or get very short of breath. Before you start a new exercise program, talk with your health care provider. This information is not intended to replace advice given to you by your health care provider. Make sure you discuss any questions you have with your health care provider. Document Revised: 09/08/2020 Document Reviewed: 09/08/2020 Elsevier Patient Education  2023 Elsevier Inc.  

## 2022-02-05 NOTE — Progress Notes (Signed)
Subjective:    Patient ID: Lauren Dunn, female    DOB: 1951/03/29, 71 y.o.   MRN: [id number]   Chief Complaint: medical management of chronic issues     HPI:  Lauren Dunn is a 71 y.o. who identifies as a female who was assigned female at birth.   Social history: Lives with: by herself- speaks with family daily Work history: retired from [employer] in today for follow up of the following chronic medical issues:  1. Primary hypertension No c/o chest pain, sob or headache. Does check blood pressure at home.has been fluctuating up and down with a high in the 081-388 systolic.  BP Readings from Last 3 Encounters:  02/05/22 (!) 160/83  07/30/21 131/81  06/14/21 129/72      2. Hyperlipidemia with target LDL less than 100 Does try to watch diet and stays very active. But does not dedicated exercise. Lab Results  Component Value Date   CHOL 135 07/30/2021   HDL 46 07/30/2021   LDLCALC 64 07/30/2021   TRIG 143 07/30/2021   CHOLHDL 2.9 07/30/2021     3. Age-related osteoporosis without current pathological fracture Last dexascan was done on 07/30/21 her t score was -1.2. she does no actual weight bearing exercise.  4. Smoker Still smoking about a pack a day  5. BMI 28.0-28.9,adult No recent weight changes Wt Readings from Last 3 Encounters:  02/05/22 152 lb (68.9 kg)  07/30/21 152 lb (68.9 kg)  06/14/21 155 lb (70.3 kg)   BMI Readings from Last 3 Encounters:  02/05/22 26.93 kg/m  07/30/21 26.93 kg/m  06/14/21 27.46 kg/m     New complaints: Has had trouble sleeping. Only sleeps 4-5 hours a night. She is taking melatonin OTC 80m and that is not helping.   Allergies  Allergen Reactions   Lipitor [Atorvastatin]     myalgia   Elemental Sulfur     Eye drop--- Inflammation to eyes   Outpatient Encounter Medications as of 02/05/2022  Medication Sig   Ascorbic Acid (VITAMIN C PO) Take by mouth.   BIOTIN PO Take by mouth.   CALCIUM PO Take by mouth.    cetirizine (ZYRTEC) 10 MG tablet Take 10 mg by mouth daily.   Cholecalciferol (VITAMIN D3 PO) Take by mouth.   Clobetasol Prop Emollient Base (CLOBETASOL PROPIONATE E) 0.05 % emollient cream Apply to affected area bid   cyclobenzaprine (FLEXERIL) 5 MG tablet Take 1 tablet (5 mg total) by mouth 3 (three) times daily as needed for muscle spasms.   denosumab (PROLIA) 60 MG/ML SOSY injection Inject 60 mg into the skin every 6 (six) months.   lisinopril-hydrochlorothiazide (ZESTORETIC) 20-12.5 MG tablet Take 1 tablet by mouth daily.   Multiple Vitamin (MULTIVITAMIN) capsule Take 1 capsule by mouth daily.   rosuvastatin (CRESTOR) 10 MG tablet Take 1 tablet (10 mg total) by mouth daily.   No facility-administered encounter medications on file as of 02/05/2022.    Past Surgical History:  Procedure Laterality Date   COLONOSCOPY  2019   2013, 2019   POLYPECTOMY     2019 last colon TA   WISDOM TOOTH EXTRACTION      Family History  Problem Relation Age of Onset   Stroke Mother    Hypertension Mother    Hypertension Sister    Hypertension Sister    Diabetes Sister    Diabetes Sister    Diabetes Brother    Diabetes Nephew    Colon cancer Neg Hx  Esophageal cancer Neg Hx    Stomach cancer Neg Hx    Rectal cancer Neg Hx    Colon polyps Neg Hx       Controlled substance contract: new 02/05/22     Review of Systems  Constitutional:  Negative for diaphoresis.  Eyes:  Negative for pain.  Respiratory:  Negative for shortness of breath.   Cardiovascular:  Negative for chest pain, palpitations and leg swelling.  Gastrointestinal:  Negative for abdominal pain.  Endocrine: Negative for polydipsia.  Skin:  Negative for rash.  Neurological:  Negative for dizziness, weakness and headaches.  Hematological:  Does not bruise/bleed easily.  All other systems reviewed and are negative.      Objective:   Physical Exam Vitals and nursing note reviewed.  Constitutional:      General: She  is not in acute distress.    Appearance: Normal appearance. She is well-developed.  HENT:     Head: Normocephalic.     Right Ear: Tympanic membrane normal.     Left Ear: Tympanic membrane normal.     Nose: Nose normal.     Mouth/Throat:     Mouth: Mucous membranes are moist.  Eyes:     Pupils: Pupils are equal, round, and reactive to light.  Neck:     Vascular: No carotid bruit or JVD.  Cardiovascular:     Rate and Rhythm: Normal rate and regular rhythm.     Heart sounds: Normal heart sounds.  Pulmonary:     Effort: Pulmonary effort is normal. No respiratory distress.     Breath sounds: Normal breath sounds. No wheezing or rales.  Chest:     Chest wall: No tenderness.  Abdominal:     General: Bowel sounds are normal. There is no distension or abdominal bruit.     Palpations: Abdomen is soft. There is no hepatomegaly, splenomegaly, mass or pulsatile mass.     Tenderness: There is no abdominal tenderness.  Musculoskeletal:        General: Normal range of motion.     Cervical back: Normal range of motion and neck supple.     Right lower leg: Edema (mild ankle edema) present.     Left lower leg: Edema (mild ankle edema) present.  Lymphadenopathy:     Cervical: No cervical adenopathy.  Skin:    General: Skin is warm and dry.  Neurological:     Mental Status: She is alert and oriented to person, place, and time.     Deep Tendon Reflexes: Reflexes are normal and symmetric.  Psychiatric:        Behavior: Behavior normal.        Thought Content: Thought content normal.        Judgment: Judgment normal.     BP (!) 152/78 (BP Location: Right Arm)   Pulse 76   Temp 98 F (36.7 C) (Temporal)   Resp 20   Ht '5\' 3"'  (1.6 m)   Wt 152 lb (68.9 kg)   SpO2 98%   BMI 26.93 kg/m         Assessment & Plan:   Lauren Dunn comes in today with chief complaint of Medical Management of Chronic Issues   Diagnosis and orders addressed:  1. Primary hypertension Low sodium diet -  CBC with Differential/Platelet - CMP14+EGFR  2. Hyperlipidemia with target LDL less than 100 Low fat diet - rosuvastatin (CRESTOR) 10 MG tablet; Take 1 tablet (10 mg total) by mouth daily.  Dispense: 90 tablet;  Refill: 1 - Lipid panel  3. Age-related osteoporosis without current pathological fracture Weight bearing exercises  4. Smoker Smoking cessation encouraged  5. BMI 28.0-28.9,adult Discussed diet and exercise for person with BMI >25 Will recheck weight in 3-6 months  6. Primary insomnia Bedtime routine - zolpidem (AMBIEN) 10 MG tablet; Take 1 tablet (10 mg total) by mouth at bedtime as needed for sleep.  Dispense: 15 tablet; Refill: 1   Labs pending Health Maintenance reviewed- schedule mammogram Diet and exercise encouraged  Follow up plan: 6 months   [name], FNP

## 2022-02-06 LAB — CMP14+EGFR
ALT: 19 IU/L (ref 0–32)
AST: 24 IU/L (ref 0–40)
Albumin/Globulin Ratio: 2.6 — ABNORMAL HIGH (ref 1.2–2.2)
Albumin: 4.9 g/dL — ABNORMAL HIGH (ref 3.8–4.8)
Alkaline Phosphatase: 51 IU/L (ref 44–121)
BUN/Creatinine Ratio: 25 (ref 12–28)
BUN: 19 mg/dL (ref 8–27)
Bilirubin Total: 0.3 mg/dL (ref 0.0–1.2)
CO2: 24 mmol/L (ref 20–29)
Calcium: 10.1 mg/dL (ref 8.7–10.3)
Chloride: 98 mmol/L (ref 96–106)
Creatinine, Ser: 0.77 mg/dL (ref 0.57–1.00)
Globulin, Total: 1.9 g/dL (ref 1.5–4.5)
Glucose: 90 mg/dL (ref 70–99)
Potassium: 4.4 mmol/L (ref 3.5–5.2)
Sodium: 139 mmol/L (ref 134–144)
Total Protein: 6.8 g/dL (ref 6.0–8.5)
eGFR: 82 mL/min/{1.73_m2} (ref >59)

## 2022-02-06 LAB — CBC WITH DIFFERENTIAL/PLATELET
Basophils Absolute: 0.1 10*3/uL (ref 0.0–0.2)
Basos: 1 %
EOS (ABSOLUTE): 0.1 10*3/uL (ref 0.0–0.4)
Eos: 1 %
Hematocrit: 39.2 % (ref 34.0–46.6)
Hemoglobin: 13.4 g/dL (ref 11.1–15.9)
Immature Grans (Abs): 0 10*3/uL (ref 0.0–0.1)
Immature Granulocytes: 0 %
Lymphocytes Absolute: 1.7 10*3/uL (ref 0.7–3.1)
Lymphs: 26 %
MCH: 30.5 pg (ref 26.6–33.0)
MCHC: 34.2 g/dL (ref 31.5–35.7)
MCV: 89 fL (ref 79–97)
Monocytes Absolute: 0.6 10*3/uL (ref 0.1–0.9)
Monocytes: 10 %
Neutrophils Absolute: 4 10*3/uL (ref 1.4–7.0)
Neutrophils: 62 %
Platelets: 267 10*3/uL (ref 150–450)
RBC: 4.39 x10E6/uL (ref 3.77–5.28)
RDW: 13.9 % (ref 11.7–15.4)
WBC: 6.5 10*3/uL (ref 3.4–10.8)

## 2022-02-06 LAB — LIPID PANEL
Chol/HDL Ratio: 2.7 ratio (ref 0.0–4.4)
Cholesterol, Total: 125 mg/dL (ref 100–199)
HDL: 47 mg/dL (ref >39)
LDL Chol Calc (NIH): 58 mg/dL (ref 0–99)
Triglycerides: 112 mg/dL (ref 0–149)
VLDL Cholesterol Cal: 20 mg/dL (ref 5–40)

## 2022-02-11 ENCOUNTER — Other Ambulatory Visit: Payer: Self-pay | Admitting: Nurse Practitioner

## 2022-02-11 DIAGNOSIS — Z1231 Encounter for screening mammogram for malignant neoplasm of breast: Secondary | ICD-10-CM

## 2022-02-13 ENCOUNTER — Ambulatory Visit
Admission: RE | Admit: 2022-02-13 | Discharge: 2022-02-13 | Disposition: A | Discharge status: Home / Self Care | Payer: Medicare PPO | Source: Ambulatory Visit | Attending: Nurse Practitioner | Admitting: Nurse Practitioner

## 2022-02-13 DIAGNOSIS — Z1231 Encounter for screening mammogram for malignant neoplasm of breast: Secondary | ICD-10-CM

## 2022-02-26 NOTE — Progress Notes (Signed)
Attempted to call patient x3 to schedule follow-up LDCT. Unable to reach patient despite numerous attempts. Referral closed at this time. Detailed VM left for patient asking to return my call if interested.

## 2022-02-27 ENCOUNTER — Other Ambulatory Visit: Payer: Self-pay

## 2022-02-27 DIAGNOSIS — Z122 Encounter for screening for malignant neoplasm of respiratory organs: Secondary | ICD-10-CM

## 2022-02-27 DIAGNOSIS — Z87891 Personal history of nicotine dependence: Secondary | ICD-10-CM

## 2022-02-27 NOTE — Progress Notes (Signed)
Order placed for follow-up LDCT. Patient scheduled for 04/03/2022 at 1700. Patient aware

## 2022-04-03 ENCOUNTER — Ambulatory Visit (HOSPITAL_COMMUNITY)
Admission: RE | Admit: 2022-04-03 | Discharge: 2022-04-03 | Disposition: A | Discharge status: Home / Self Care | Payer: Medicare PPO | Source: Ambulatory Visit | Attending: Hematology | Admitting: Hematology

## 2022-04-03 DIAGNOSIS — F17210 Nicotine dependence, cigarettes, uncomplicated: Secondary | ICD-10-CM | POA: Diagnosis not present

## 2022-04-03 DIAGNOSIS — Z87891 Personal history of nicotine dependence: Secondary | ICD-10-CM | POA: Insufficient documentation

## 2022-04-03 DIAGNOSIS — Z122 Encounter for screening for malignant neoplasm of respiratory organs: Secondary | ICD-10-CM | POA: Insufficient documentation

## 2022-04-16 ENCOUNTER — Telehealth: Payer: Self-pay | Admitting: Nurse Practitioner

## 2022-04-16 NOTE — Telephone Encounter (Signed)
[employer] calling about medication clarification for patient. She stated that the Code J0897 is for Prolia or Delton See, they need to know which one is needed. Please fax this information back to [phone]   Ref # [id number]

## 2022-04-16 NOTE — Telephone Encounter (Signed)
Note faxed to inform [employer] of PROLIA injection.

## 2022-07-23 ENCOUNTER — Telehealth: Payer: Self-pay | Admitting: Nurse Practitioner

## 2022-07-23 NOTE — Telephone Encounter (Signed)
Lauren Dunn (Key: [id number]) PA for Prolia  Your information has been sent to [facility].

## 2022-07-24 NOTE — Telephone Encounter (Signed)
Left message for patient that she is due for Prolia and to call back and schedule. We have dose in refrigerator for her.

## 2022-07-24 NOTE — Telephone Encounter (Signed)
Lauren Dunn (Key: [id number]) PA Case ID #: [id number] Outcome Approved today PA Case: [id number], Status: Approved, Coverage Starts on: 01/02/2022 12:00:00 AM, Coverage Ends on: 05/27/2023 12:00:00 AM. Questions? Contact [phone].

## 2022-08-06 ENCOUNTER — Ambulatory Visit: Payer: Medicare PPO | Admitting: Nurse Practitioner

## 2022-08-06 ENCOUNTER — Encounter: Payer: Self-pay | Admitting: Nurse Practitioner

## 2022-08-06 ENCOUNTER — Other Ambulatory Visit: Payer: Self-pay

## 2022-08-06 VITALS — BP 139/77 | HR 76 | Temp 97.8°F | Ht 63.0 in | Wt 156.0 lb

## 2022-08-06 DIAGNOSIS — F17200 Nicotine dependence, unspecified, uncomplicated: Secondary | ICD-10-CM

## 2022-08-06 DIAGNOSIS — Z6828 Body mass index (BMI) 28.0-28.9, adult: Secondary | ICD-10-CM | POA: Diagnosis not present

## 2022-08-06 DIAGNOSIS — M810 Age-related osteoporosis without current pathological fracture: Secondary | ICD-10-CM

## 2022-08-06 DIAGNOSIS — E785 Hyperlipidemia, unspecified: Secondary | ICD-10-CM | POA: Diagnosis not present

## 2022-08-06 DIAGNOSIS — Z0001 Encounter for general adult medical examination with abnormal findings: Secondary | ICD-10-CM | POA: Diagnosis not present

## 2022-08-06 DIAGNOSIS — I10 Essential (primary) hypertension: Secondary | ICD-10-CM | POA: Diagnosis not present

## 2022-08-06 DIAGNOSIS — Z0000 Encounter for general adult medical examination without abnormal findings: Secondary | ICD-10-CM | POA: Diagnosis not present

## 2022-08-06 DIAGNOSIS — L989 Disorder of the skin and subcutaneous tissue, unspecified: Secondary | ICD-10-CM

## 2022-08-06 MED ORDER — ROSUVASTATIN CALCIUM 10 MG PO TABS: 10.0000 mg | ORAL_TABLET | Freq: Every day | ORAL | 1 refills | Status: DC

## 2022-08-06 MED ORDER — DENOSUMAB 60 MG/ML ~~LOC~~ SOSY
60.0000 mg | PREFILLED_SYRINGE | Freq: Once | SUBCUTANEOUS | Status: AC
  Administered 2022-08-06: 60 mg via SUBCUTANEOUS

## 2022-08-06 MED ORDER — LISINOPRIL-HYDROCHLOROTHIAZIDE 20-12.5 MG PO TABS: 2.0000 | ORAL_TABLET | Freq: Every day | ORAL | 1 refills | Status: DC

## 2022-08-06 NOTE — Addendum Note (Signed)
Addended by: Chevis Pretty on: 08/06/2022 10:56 AM   Modules accepted: Orders

## 2022-08-06 NOTE — Addendum Note (Signed)
Addended by: Rolena Infante on: 08/06/2022 10:50 AM   Modules accepted: Orders

## 2022-08-06 NOTE — Progress Notes (Addendum)
Subjective:    Patient ID: Lauren Dunn, female    DOB: 10/24/1950, 72 y.o.   MRN: [id number]   Chief Complaint: annual physical   HPI:  Lauren Dunn is a 72 y.o. who identifies as a female who was assigned female at birth.   Social history: Lives with: by hrself Work history: retired from [employer] in today for follow up of the following chronic medical issues:  1. Annual physical exam   2. Primary hypertension No c/o chest pain, sob or headache. Does not check blood pressure at home. BP Readings from Last 3 Encounters:  02/05/22 (!) 152/78  07/30/21 131/81  06/14/21 129/72     3. Hyperlipidemia with target LDL less than 100 Does try to watch diet and tries to stay very active. Lab Results  Component Value Date   CHOL 125 02/05/2022   HDL 47 02/05/2022   LDLCALC 58 02/05/2022   TRIG 112 02/05/2022   CHOLHDL 2.7 02/05/2022   The ASCVD Risk score (Arnett DK, et al., 2019) failed to calculate for the following reasons:   The valid total cholesterol range is 130 to 320 mg/dL   4. Age-related osteoporosis without current pathological fracture Last dexascan was done on 07/30/21. T score was -1.2. shei s currently on prolia injections.  5. Smoker Smokes 1/2 -1 pack a day  6. BMI 28.0-28.9,adult Weight is up 4 lbs Wt Readings from Last 3 Encounters:  08/06/22 156 lb (70.8 kg)  02/05/22 152 lb (68.9 kg)  07/30/21 152 lb (68.9 kg)   BMI Readings from Last 3 Encounters:  08/06/22 27.63 kg/m  02/05/22 26.93 kg/m  07/30/21 26.93 kg/m      New complaints: None today  Allergies  Allergen Reactions   Lipitor [Atorvastatin]     myalgia   Elemental Sulfur     Eye drop--- Inflammation to eyes   Outpatient Encounter Medications as of 08/06/2022  Medication Sig   Ascorbic Acid (VITAMIN C PO) Take by mouth.   BIOTIN PO Take by mouth.   CALCIUM PO Take by mouth.   cetirizine (ZYRTEC) 10 MG tablet Take 10 mg by mouth daily.   Cholecalciferol (VITAMIN D3  PO) Take by mouth.   Clobetasol Prop Emollient Base (CLOBETASOL PROPIONATE E) 0.05 % emollient cream Apply to affected area bid   cyclobenzaprine (FLEXERIL) 5 MG tablet Take 1 tablet (5 mg total) by mouth 3 (three) times daily as needed for muscle spasms.   denosumab (PROLIA) 60 MG/ML SOSY injection Inject 60 mg into the skin every 6 (six) months.   lisinopril-hydrochlorothiazide (ZESTORETIC) 20-12.5 MG tablet Take 2 tablets by mouth daily.   Multiple Vitamin (MULTIVITAMIN) capsule Take 1 capsule by mouth daily.   rosuvastatin (CRESTOR) 10 MG tablet Take 1 tablet (10 mg total) by mouth daily.   zolpidem (AMBIEN) 10 MG tablet Take 1 tablet (10 mg total) by mouth at bedtime as needed for sleep.   No facility-administered encounter medications on file as of 08/06/2022.    Past Surgical History:  Procedure Laterality Date   COLONOSCOPY  2019   2013, 2019   POLYPECTOMY     2019 last colon TA   WISDOM TOOTH EXTRACTION      Family History  Problem Relation Age of Onset   Stroke Mother    Hypertension Mother    Hypertension Sister    Hypertension Sister    Diabetes Sister    Diabetes Sister    Diabetes Brother  Diabetes Nephew    Colon cancer Neg Hx    Esophageal cancer Neg Hx    Stomach cancer Neg Hx    Rectal cancer Neg Hx    Colon polyps Neg Hx    Breast cancer Neg Hx       Controlled substance contract: n/a      Review of Systems  Constitutional:  Negative for diaphoresis.  Eyes:  Negative for pain.  Respiratory:  Negative for shortness of breath.   Cardiovascular:  Negative for chest pain, palpitations and leg swelling.  Gastrointestinal:  Negative for abdominal pain.  Endocrine: Negative for polydipsia.  Skin:  Negative for rash.  Neurological:  Negative for dizziness, weakness and headaches.  Hematological:  Does not bruise/bleed easily.  All other systems reviewed and are negative.      Objective:   Physical Exam Vitals and nursing note reviewed.   Constitutional:      General: She is not in acute distress.    Appearance: Normal appearance. She is well-developed.  HENT:     Head: Normocephalic.     Right Ear: Tympanic membrane normal.     Left Ear: Tympanic membrane normal.     Nose: Nose normal.     Mouth/Throat:     Mouth: Mucous membranes are moist.  Eyes:     Pupils: Pupils are equal, round, and reactive to light.  Neck:     Vascular: No carotid bruit or JVD.  Cardiovascular:     Rate and Rhythm: Normal rate and regular rhythm.     Heart sounds: Normal heart sounds.  Pulmonary:     Effort: Pulmonary effort is normal. No respiratory distress.     Breath sounds: Normal breath sounds. No wheezing or rales.  Chest:     Chest wall: No tenderness.  Abdominal:     General: Bowel sounds are normal. There is no distension or abdominal bruit.     Palpations: Abdomen is soft. There is no hepatomegaly, splenomegaly, mass or pulsatile mass.     Tenderness: There is no abdominal tenderness.  Musculoskeletal:        General: Normal range of motion.     Cervical back: Normal range of motion and neck supple.  Lymphadenopathy:     Cervical: No cervical adenopathy.  Skin:    General: Skin is warm and dry.  Neurological:     Mental Status: She is alert and oriented to person, place, and time.     Deep Tendon Reflexes: Reflexes are normal and symmetric.  Psychiatric:        Behavior: Behavior normal.        Thought Content: Thought content normal.        Judgment: Judgment normal.    BP 139/77   Pulse 76   Temp 97.8 F (36.6 C) (Temporal)   Ht '5\' 3"'$  (1.6 m)   Wt 156 lb (70.8 kg)   SpO2 97%   BMI 27.63 kg/m         Assessment & Plan:  Lauren Dunn comes in today with chief complaint of Medical Management of Chronic Issues   Diagnosis and orders addressed:  1. Annual physical exam  2. Primary hypertension Low sodium diet - EKG 12-Lead - lisinopril-hydrochlorothiazide (ZESTORETIC) 20-12.5 MG tablet; Take 2  tablets by mouth daily.  Dispense: 180 tablet; Refill: 1  3. Hyperlipidemia with target LDL less than 100 Low fta diet - rosuvastatin (CRESTOR) 10 MG tablet; Take 1 tablet (10 mg total) by mouth daily.  Dispense: 90 tablet; Refill: 1  4. Age-related osteoporosis without current pathological fracture Weight bearing exercises  5. Smoker Smoking cessation encouraged  6. BMI 28.0-28.9,adult Discussed diet and exercise for person with BMI >25 Will recheck weight in 3-6 months  Orders Placed This Encounter  Procedures   CBC with Differential/Platelet   CMP14+EGFR   Lipid panel   Thyroid Panel With TSH   VITAMIN D 25 Hydroxy (Vit-D Deficiency, Fractures)   EKG 12-Lead     Labs pending Health Maintenance reviewed Diet and exercise encouraged  Follow up plan: 6 months   [place], FNP

## 2022-08-07 LAB — CBC WITH DIFFERENTIAL/PLATELET
Basophils Absolute: 0.1 10*3/uL (ref 0.0–0.2)
Basos: 1 %
EOS (ABSOLUTE): 0.1 10*3/uL (ref 0.0–0.4)
Eos: 1 %
Hematocrit: 40.2 % (ref 34.0–46.6)
Hemoglobin: 13.5 g/dL (ref 11.1–15.9)
Immature Grans (Abs): 0 10*3/uL (ref 0.0–0.1)
Immature Granulocytes: 0 %
Lymphocytes Absolute: 1.7 10*3/uL (ref 0.7–3.1)
Lymphs: 21 %
MCH: 30.3 pg (ref 26.6–33.0)
MCHC: 33.6 g/dL (ref 31.5–35.7)
MCV: 90 fL (ref 79–97)
Monocytes Absolute: 0.6 10*3/uL (ref 0.1–0.9)
Monocytes: 8 %
Neutrophils Absolute: 5.5 10*3/uL (ref 1.4–7.0)
Neutrophils: 69 %
Platelets: 288 10*3/uL (ref 150–450)
RBC: 4.45 x10E6/uL (ref 3.77–5.28)
RDW: 12.2 % (ref 11.7–15.4)
WBC: 7.9 10*3/uL (ref 3.4–10.8)

## 2022-08-07 LAB — CMP14+EGFR
ALT: 17 IU/L (ref 0–32)
AST: 23 IU/L (ref 0–40)
Albumin/Globulin Ratio: 2.2 (ref 1.2–2.2)
Albumin: 4.6 g/dL (ref 3.8–4.8)
Alkaline Phosphatase: 48 IU/L (ref 44–121)
BUN/Creatinine Ratio: 23 (ref 12–28)
BUN: 18 mg/dL (ref 8–27)
Bilirubin Total: 0.5 mg/dL (ref 0.0–1.2)
CO2: 23 mmol/L (ref 20–29)
Calcium: 10.5 mg/dL — ABNORMAL HIGH (ref 8.7–10.3)
Chloride: 98 mmol/L (ref 96–106)
Creatinine, Ser: 0.79 mg/dL (ref 0.57–1.00)
Globulin, Total: 2.1 g/dL (ref 1.5–4.5)
Glucose: 99 mg/dL (ref 70–99)
Potassium: 4.3 mmol/L (ref 3.5–5.2)
Sodium: 137 mmol/L (ref 134–144)
Total Protein: 6.7 g/dL (ref 6.0–8.5)
eGFR: 79 mL/min/{1.73_m2} (ref >59)

## 2022-08-07 LAB — THYROID PANEL WITH TSH
Free Thyroxine Index: 2.2 (ref 1.2–4.9)
T3 Uptake Ratio: 25 % (ref 24–39)
T4, Total: 8.7 ug/dL (ref 4.5–12.0)
TSH: 1.41 u[IU]/mL (ref 0.450–4.500)

## 2022-08-07 LAB — LIPID PANEL
Chol/HDL Ratio: 2.9 ratio (ref 0.0–4.4)
Cholesterol, Total: 140 mg/dL (ref 100–199)
HDL: 48 mg/dL (ref >39)
LDL Chol Calc (NIH): 62 mg/dL (ref 0–99)
Triglycerides: 179 mg/dL — ABNORMAL HIGH (ref 0–149)
VLDL Cholesterol Cal: 30 mg/dL (ref 5–40)

## 2022-08-07 LAB — VITAMIN D 25 HYDROXY (VIT D DEFICIENCY, FRACTURES): Vit D, 25-Hydroxy: 41.2 ng/mL (ref 30.0–100.0)

## 2022-08-08 NOTE — Telephone Encounter (Signed)
Prolia given 3/12

## 2022-09-30 IMAGING — MG MM DIGITAL SCREENING BILAT W/ TOMO AND CAD
8 series · 9 of 24 positions shown · non-contrast
Comparison: Previous exam(s).

CLINICAL DATA: Screening.

EXAM:
DIGITAL SCREENING BILATERAL MAMMOGRAM WITH TOMOSYNTHESIS AND CAD
TECHNIQUE: Bilateral screening digital craniocaudal and mediolateral oblique
mammograms were obtained. Bilateral screening digital breast
tomosynthesis was performed. The images were evaluated with
computer-aided detection.

[L MLO synth-2D]
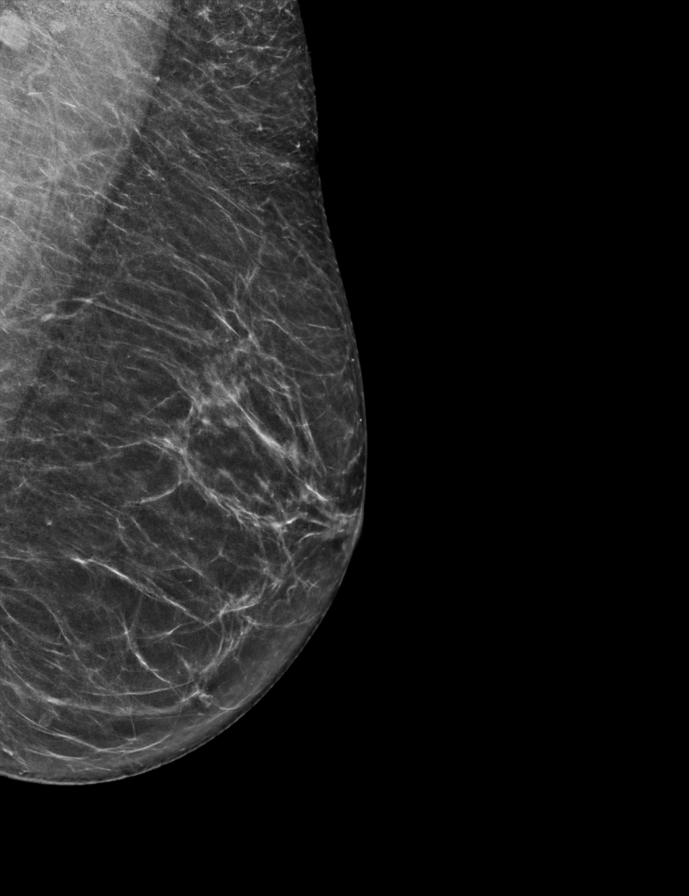

[R CC synth-2D]
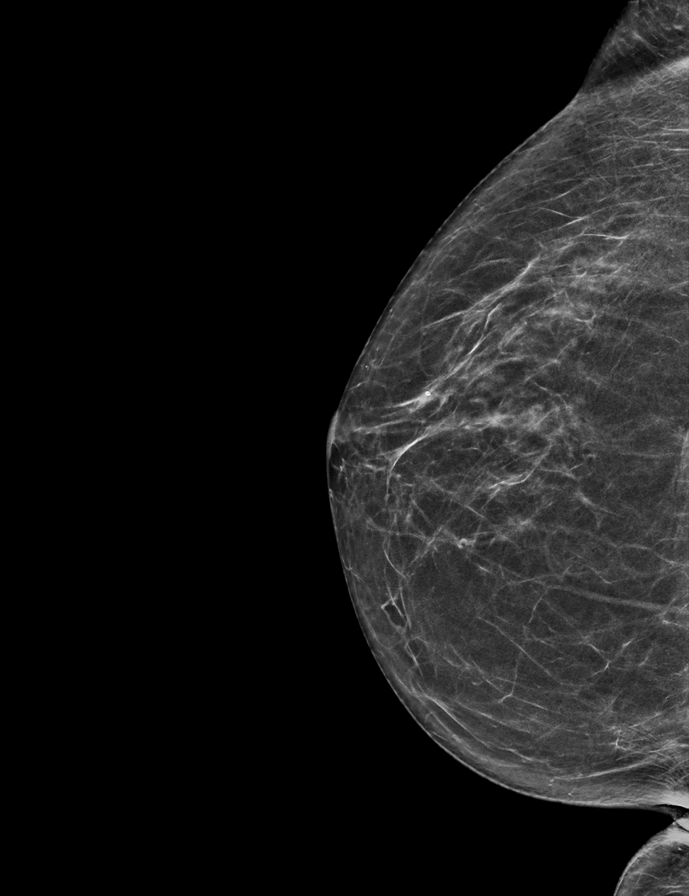

[L CC synth-2D]
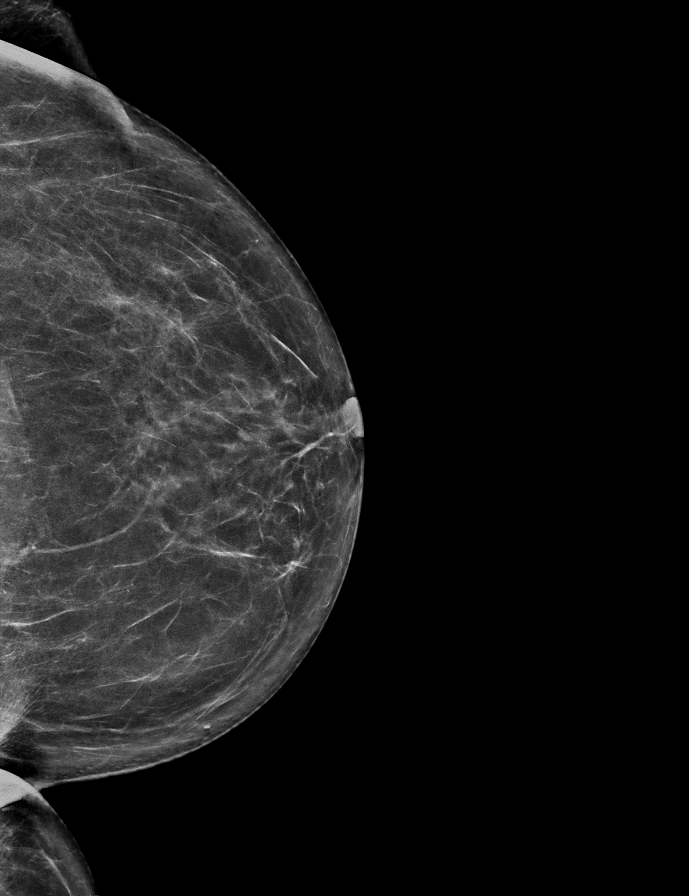

[R MLO synth-2D]
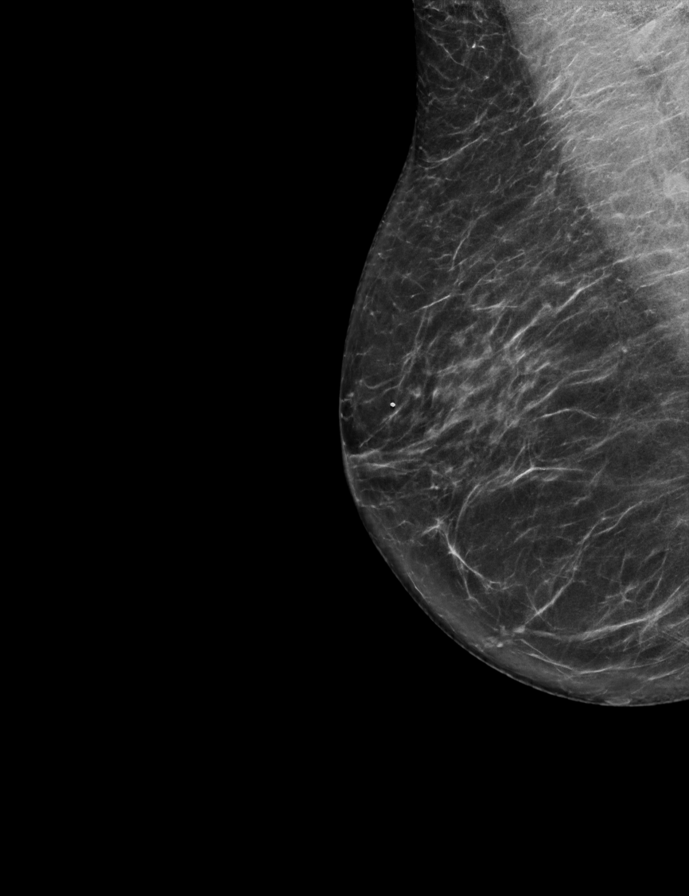

[R MLO tomo · 2 of 67 frames shown]
[frame 22/67]
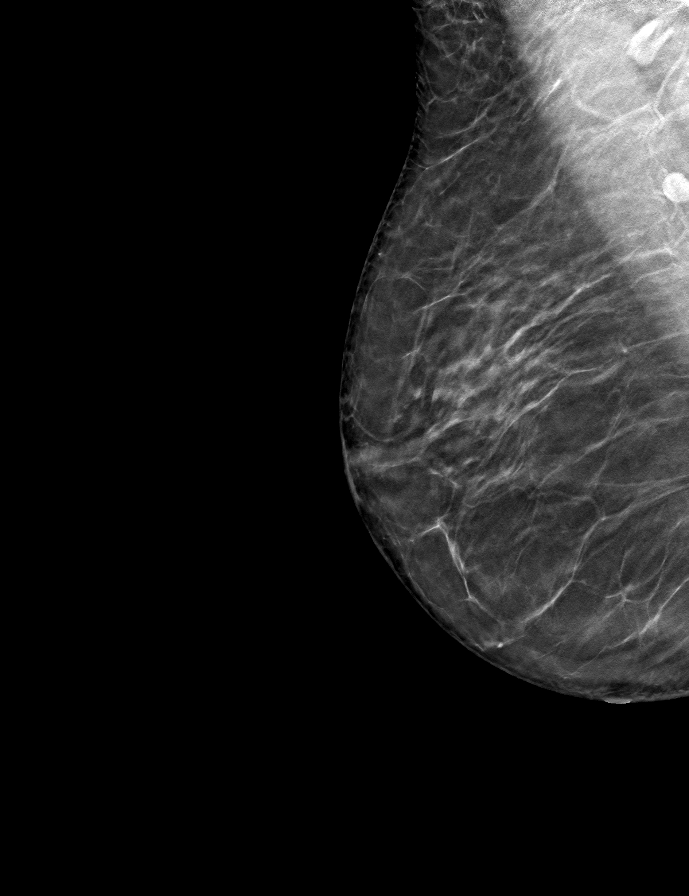
[frame 34/67]
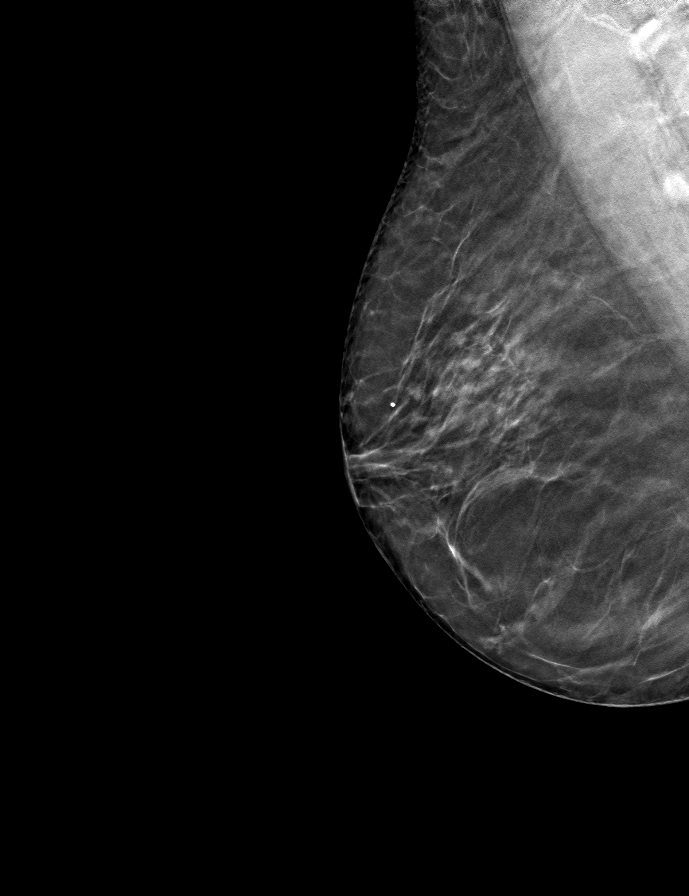

[L CC tomo · tomo slice 35/69.0]
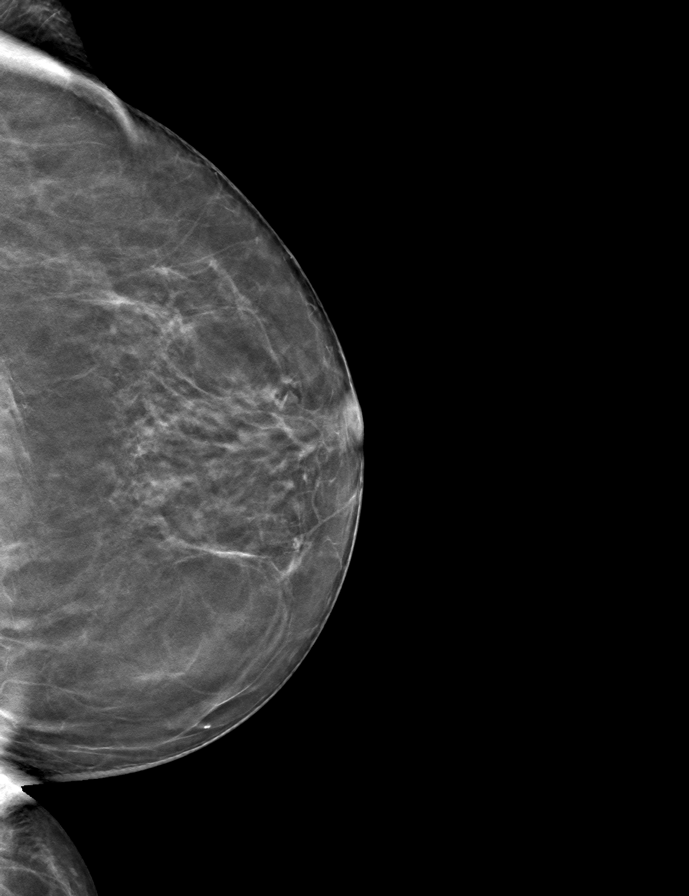

[L MLO tomo · tomo slice 31/62.0]
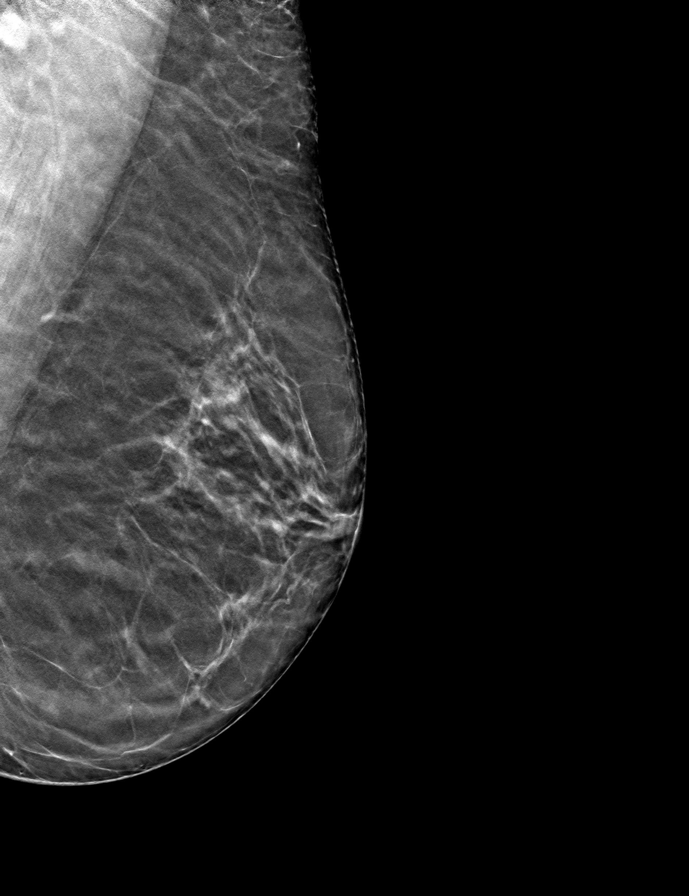

[R CC tomo · tomo slice 30/59.0]
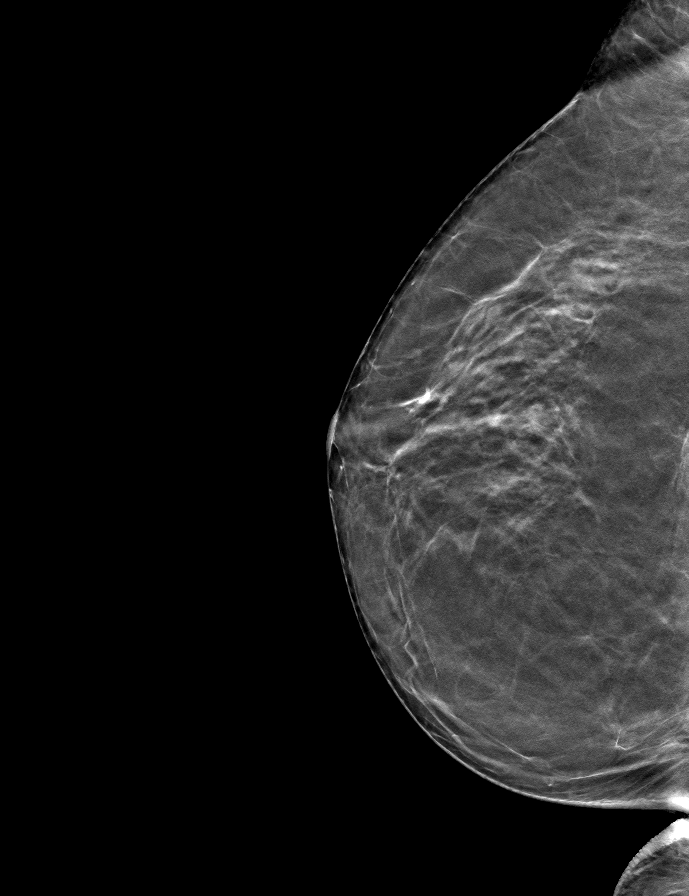

[9 of 24 positions shown; findings below may reference images not displayed]

ACR Breast Density Category b: There are scattered areas of
fibroglandular density.
FINDINGS: There are no findings suspicious for malignancy.
IMPRESSION: No mammographic evidence of malignancy. A result letter of this
screening mammogram will be mailed directly to the patient.

RECOMMENDATION:
Screening mammogram in one year. (Code:51-O-LD2)

BI-RADS CATEGORY  1: Negative.

## 2022-10-29 ENCOUNTER — Telehealth: Payer: Self-pay | Admitting: Nurse Practitioner

## 2022-11-12 ENCOUNTER — Ambulatory Visit (INDEPENDENT_AMBULATORY_CARE_PROVIDER_SITE_OTHER): Payer: Medicare PPO

## 2022-11-12 ENCOUNTER — Encounter: Payer: Self-pay | Admitting: Nurse Practitioner

## 2022-11-12 ENCOUNTER — Ambulatory Visit: Payer: Medicare PPO | Admitting: Nurse Practitioner

## 2022-11-12 VITALS — BP 122/68 | HR 76 | Temp 98.3°F | Ht 63.0 in | Wt 157.2 lb

## 2022-11-12 DIAGNOSIS — M25562 Pain in left knee: Secondary | ICD-10-CM

## 2022-11-12 MED ORDER — MELOXICAM 7.5 MG PO TABS: 7.5000 mg | ORAL_TABLET | Freq: Every day | ORAL | 0 refills | Status: DC

## 2022-11-12 NOTE — Progress Notes (Signed)
   Acute Office Visit  Subjective:     Patient ID: Lauren Dunn, female    DOB: 1951-05-22, 72 y.o.   MRN: [id number]  Chief Complaint  Patient presents with   Knee Pain    Has been having pain off and on since march. But past month has had pain every day.     HPI  Lauren Dunn is a 72 y.o. female who sustained a left knee injury 6 month(s) ago. Mechanism of injury: bending working in the garden. Immediate symptoms: delayed swelling, no deformity was noted by the patient. Symptoms have been intermittent since that time. Prior history of related problems:with the same knee last summer" I will have flare up at least twice yearly" Describes the pain as dull, 5-6/10 most day. OTC tylenol take it down to 2/10.  Pain is worsting the morning  "when I woke in the morning it is worst and as the progrss it get better" Minimal with heat an ice, an dknee sleeve". X-ray preliminary ready shows no fx, effusion     ROS Negative unless indicated in HPI    Objective:    BP 122/68   Pulse 76   Temp 98.3 F (36.8 C) (Temporal)   Ht 5\' 3"  (1.6 m)   Wt 157 lb 3.2 oz (71.3 kg)   SpO2 96%   BMI 27.85 kg/m  BP Readings from Last 3 Encounters:  11/12/22 122/68  08/06/22 139/77  02/05/22 (!) 152/78   Wt Readings from Last 3 Encounters:  11/12/22 157 lb 3.2 oz (71.3 kg)  08/06/22 156 lb (70.8 kg)  02/05/22 152 lb (68.9 kg)      Physical Exam Appearance: alert, well appearing, and in no distress and overweight.  If I do a lot of walking Knee exam: reduced range of motion, negative drawer sign, ligamentous instability medially. X-ray: ordered, but results not yet available, no fracture or dislocation noted, pending review by radiologist. No results found for any visits on 11/12/22.      Assessment & Plan:  Left knee pain, unspecified chronicity -     DG Knee 1-2 Views Left -     Meloxicam; Take 1 tablet (7.5 mg total) by mouth daily.  Dispense: 30 tablet; Refill: 0   ASSESSMENT: Knee  underlying chronic DJD likely  PLAN: Mobic 7.5 mg 1-tab BID PRN for pain Get a weed puller to avoid bending Minimal bending  rest the injured area as much as practical, compressive bandage, X-Ray ordered, see primary care physician in follow up, prescription for analgesic given See orders for this visit as documented in the electronic medical record.  Return if symptoms worsen or fail to improve.  [address] [place] Lighter DNP

## 2022-12-16 ENCOUNTER — Encounter: Payer: Self-pay | Admitting: Dermatology

## 2022-12-16 ENCOUNTER — Ambulatory Visit: Payer: Medicare PPO | Admitting: Dermatology

## 2022-12-16 VITALS — BP 130/86 | HR 74

## 2022-12-16 DIAGNOSIS — D485 Neoplasm of uncertain behavior of skin: Secondary | ICD-10-CM

## 2022-12-16 DIAGNOSIS — Z808 Family history of malignant neoplasm of other organs or systems: Secondary | ICD-10-CM

## 2022-12-16 DIAGNOSIS — C441122 Basal cell carcinoma of skin of right lower eyelid, including canthus: Secondary | ICD-10-CM

## 2022-12-16 DIAGNOSIS — C4491 Basal cell carcinoma of skin, unspecified: Secondary | ICD-10-CM

## 2022-12-16 HISTORY — DX: Basal cell carcinoma of skin, unspecified: C44.91

## 2022-12-16 NOTE — Patient Instructions (Addendum)
Thank you for visiting my office today. I appreciate your commitment to addressing your skin health concerns. Here is a summary of our consultation and the steps we are taking to ensure the best care for your skin:  - Biopsy Performed: A small sample of the concerning skin spot was taken for laboratory analysis to confirm if it is basal cell carcinoma.  - Post-Biopsy Care:   - Keep the band-aid on until tonight or tomorrow morning.    - After removing the band-aid, wash your face as usual and apply a small amount of Vaseline to the biopsy area.    - Avoid using anti-wrinkle creams or products containing retinol on the affected area. - Follow-Up:   - We will contact you with the biopsy results.   - If the diagnosis is confirmed as basal cell carcinoma, a referral to a Mohs surgeon will be arranged.    - A full skin check appointment will be scheduled at your convenience.  Please feel free to reach out if you have any questions or concerns while you await your results. Your health and comfort are our top priority.   Patient Handout: Wound Care for Skin Biopsy Site  Taking Care of Your Skin Biopsy Site  Proper care of the biopsy site is essential for promoting healing and minimizing scarring. This handout provides instructions on how to care for your biopsy site to ensure optimal recovery.  1. Cleaning the Wound:  Clean the biopsy site daily with gentle soap and water. Gently pat the area dry with a clean, soft towel. Avoid harsh scrubbing or rubbing the area, as this can irritate the skin and delay healing.  2. Applying Aquaphor and Bandage:  After cleaning the wound, apply a thin layer of Aquaphor ointment to the biopsy site. Cover the area with a sterile bandage to protect it from dirt, bacteria, and friction. Change the bandage daily or as needed if it becomes soiled or wet.  3. Continued Care for One Week:  Repeat the cleaning, Aquaphor application, and bandaging process  daily for one week following the biopsy procedure. Keeping the wound clean and moist during this initial healing period will help prevent infection and promote optimal healing.  4. Massaging Aquaphor into the Area:  ---After one week, discontinue the use of bandages but continue to apply Aquaphor to the biopsy site. ----Gently massage the Aquaphor into the area using circular motions. ---Massaging the skin helps to promote circulation and prevent the formation of scar tissue.   Additional Tips:  Avoid exposing the biopsy site to direct sunlight during the healing process, as this can cause hyperpigmentation or worsen scarring. If you experience any signs of infection, such as increased redness, swelling, warmth, or drainage from the wound, contact your healthcare provider immediately. Follow any additional instructions provided by your healthcare provider for caring for the biopsy site and managing any discomfort. Conclusion:  Taking proper care of your skin biopsy site is crucial for ensuring optimal healing and minimizing scarring. By following these instructions for cleaning, applying Aquaphor, and massaging the area, you can promote a smooth and successful recovery. If you have any questions or concerns about caring for your biopsy site, don't hesitate to contact your healthcare provider for guidance.     Due to recent changes in healthcare laws, you may see results of your pathology and/or laboratory studies on MyChart before the doctors have had a chance to review them. We understand that in some cases there may be results  that are confusing or concerning to you. Please understand that not all results are received at the same time and often the doctors may need to interpret multiple results in order to provide you with the best plan of care or course of treatment. Therefore, we ask that you please give [place] 2 business days to thoroughly review all your results before contacting the office  for clarification. Should we see a critical lab result, you will be contacted sooner.   If You Need Anything After Your Visit  If you have any questions or concerns for your doctor, please call our main line at [phone] If no one answers, please leave a voicemail as directed and we will return your call as soon as possible. Messages left after 4 pm will be answered the following business day.   You may also send [place] a message via MyChart. We typically respond to MyChart messages within 1-2 business days.  For prescription refills, please ask your pharmacy to contact our office. Our fax number is [phone].  If you have an urgent issue when the clinic is closed that cannot wait until the next business day, you can page your doctor at the number below.    Please note that while we do our best to be available for urgent issues outside of office hours, we are not available 24/7.   If you have an urgent issue and are unable to reach [place], you may choose to seek medical care at your doctor's office, retail clinic, urgent care center, or emergency room.  If you have a medical emergency, please immediately call 911 or go to the emergency department. In the event of inclement weather, please call our main line at [phone] for an update on the status of any delays or closures.  Dermatology Medication Tips: Please keep the boxes that topical medications come in in order to help keep track of the instructions about where and how to use these. Pharmacies typically print the medication instructions only on the boxes and not directly on the medication tubes.   If your medication is too expensive, please contact our office at [phone] or send [place] a message through MyChart.   We are unable to tell what your co-pay for medications will be in advance as this is different depending on your insurance coverage. However, we may be able to find a substitute medication at lower cost or fill out paperwork  to get insurance to cover a needed medication.   If a prior authorization is required to get your medication covered by your insurance company, please allow [place] 1-2 business days to complete this process.  Drug prices often vary depending on where the prescription is filled and some pharmacies may offer cheaper prices.  The website www.goodrx.com contains coupons for medications through different pharmacies. The prices here do not account for what the cost may be with help from insurance (it may be cheaper with your insurance), but the website can give you the price if you did not use any insurance.  - You can print the associated coupon and take it with your prescription to the pharmacy.  - You may also stop by our office during regular business hours and pick up a GoodRx coupon card.  - If you need your prescription sent electronically to a different pharmacy, notify our office through [facility] or by phone at [phone]

## 2022-12-16 NOTE — Progress Notes (Signed)
   New Patient Visit   Subjective  Lauren Dunn is a 72 y.o. female who presents for the following: Irregular skin lesion R med lower eyelid x 1 year. Doesn't bleed, but lesion does change in appearance and size, will not resolve, and crusts. No personal hx of skin cancer or dysplastic nevi, Fhx skin cancer in sister [place]).    The following portions of the chart were reviewed this encounter and updated as appropriate: medications, allergies, medical history  Review of Systems:  No other skin or systemic complaints except as noted in HPI or Assessment and Plan.  Objective  Well appearing patient in no apparent distress; mood and affect are within normal limits.   A focused examination was performed of the following areas:   Relevant exam findings are noted in the Assessment and Plan.  R med lower eyelid 0.6 pearly pink papule with vessels       Assessment & Plan   1. Suspected Basal Cell Carcinoma on Left Medial Lower Eyelid - Assessment: Suspected Basal Cell Carcinoma located on the left medial lower eyelid, near but not at the medial canthus. Biopsy performed for diagnostic confirmation. - Plan: If diagnosis is confirmed, refer patient to a Mohs surgeon for excision to ensure clear margins. Educate patient on the importance of sun protection and skin cancer prevention strategies.  2. Follow-Up and Total Body Skin Check - Assessment: Patient awaits biopsy results and requires a comprehensive skin examination. - Plan: Call patient with biopsy results once available. Schedule a total body skin check appointment at the patient's convenience to monitor for any additional skin concerns.  Neoplasm of uncertain behavior of skin R med lower eyelid  Skin / nail biopsy Type of biopsy: tangential   Informed consent: discussed and consent obtained   Timeout: patient name, date of birth, surgical site, and procedure verified   Procedure prep:  Patient was prepped and draped in usual  sterile fashion Prep type:  Isopropyl alcohol Anesthesia: the lesion was anesthetized in a standard fashion   Anesthetic:  1% lidocaine w/ epinephrine 1-100,000 buffered w/ 8.4% NaHCO3 Instrument used: flexible razor blade   Hemostasis achieved with: pressure, aluminum chloride and electrodesiccation   Outcome: patient tolerated procedure well   Post-procedure details: sterile dressing applied and wound care instructions given   Dressing type: bandage and petrolatum    Specimen 1 - Surgical pathology Differential Diagnosis: D48.5 r/o BCC vs sebaceous hyperplasia Check Margins: No  If positive for [facility] recommend referral for Mohs.    Return for TBSE at next available appointment.  Maylene Roes, CMA, am acting as scribe for [employer], DO .   Documentation: I have reviewed the above documentation for accuracy and completeness, and I agree with the above.  Langston Reusing, DO

## 2022-12-18 NOTE — Progress Notes (Signed)
Hi Lauren Dunn,  Please call patient and notify that results of biopsy were positive for a skin cancer that needs to be treated with Mohs Surgery.  Diagnosis Skin , R med lower eyelid BASAL CELL CARCINOMA, SUPERFICIAL AND NODULAR PATTERNS  --> Mohs   We will refer to Dr. Jeannine Boga at [facility]  [facility] [address] [place], [address] [place], [place] [address]  Phone: [phone] Fax: [phone]

## 2022-12-23 ENCOUNTER — Telehealth: Payer: Self-pay

## 2022-12-23 DIAGNOSIS — C44111 Basal cell carcinoma of skin of unspecified eyelid, including canthus: Secondary | ICD-10-CM

## 2022-12-23 NOTE — Telephone Encounter (Signed)
Advised patient of results and will refer her to Dr Jeannine Boga for Mohs/hd

## 2022-12-31 ENCOUNTER — Telehealth: Payer: Self-pay

## 2022-12-31 NOTE — Telephone Encounter (Addendum)
Patient is anxious to get scheduled at the skin surgery center. I have placed calls twice this week to their office without response. I tried to reassure her that they should be reaching out to her within 2 weeks of them getting the referral. She expressed understanding.

## 2023-01-09 ENCOUNTER — Telehealth: Payer: Self-pay

## 2023-01-09 ENCOUNTER — Telehealth: Payer: Self-pay | Admitting: Nurse Practitioner

## 2023-01-09 NOTE — Telephone Encounter (Signed)
Created new encounter for Prolia BIV. Will route encounter back once benefit verification is complete.  

## 2023-01-09 NOTE — Telephone Encounter (Signed)
Prolia VOB initiated via [email or web address]  Next Prolia inj DUE: 02/03/23

## 2023-01-09 NOTE — Telephone Encounter (Signed)
Please check benefits for Prolia - last injection: 08/06/22

## 2023-01-13 ENCOUNTER — Other Ambulatory Visit (HOSPITAL_COMMUNITY): Payer: Self-pay

## 2023-01-13 NOTE — Telephone Encounter (Signed)
Pt ready for scheduling for PROLIA on or after : 02/03/23  Out-of-pocket cost due at time of visit: $80  Primary: [employer] Prolia co-insurance: $40 Admin fee co-insurance: $40  Secondary: --- Prolia co-insurance:  Admin fee co-insurance:   Medical Benefit Details: Date Benefits were checked: 01/09/23 Deductible: NO/ Coinsurance: $40/ Admin Fee: $40  Prior Auth: APPROVED PA# [id number] Expiration Date: 01/02/22-05/27/23  # of doses approved: 2  Pharmacy benefit: Copay $64 If patient wants fill through the pharmacy benefit please send prescription to: [employer], and include estimated need by date in rx notes. Pharmacy will ship medication directly to the office.  Patient NOT eligible for Prolia Copay Card. Copay Card can make patient's cost as little as $25. Link to apply: [email or web address]://[email or web address].[email or web address]  ** This summary of benefits is an estimation of the patient's out-of-pocket cost. Exact cost may very based on individual plan coverage. ;

## 2023-01-22 NOTE — Telephone Encounter (Signed)
Left message for patient to call back to schedule Prolia injection anytime after 02/07/23.  Medication is in the refrigerator, buy and bill.

## 2023-02-03 ENCOUNTER — Other Ambulatory Visit: Payer: Self-pay | Admitting: Nurse Practitioner

## 2023-02-03 ENCOUNTER — Ambulatory Visit: Payer: Medicare PPO | Admitting: Nurse Practitioner

## 2023-02-03 DIAGNOSIS — I10 Essential (primary) hypertension: Secondary | ICD-10-CM

## 2023-02-03 DIAGNOSIS — E785 Hyperlipidemia, unspecified: Secondary | ICD-10-CM

## 2023-02-06 DIAGNOSIS — C441122 Basal cell carcinoma of skin of right lower eyelid, including canthus: Secondary | ICD-10-CM | POA: Diagnosis not present

## 2023-02-06 DIAGNOSIS — S01111S Laceration without foreign body of right eyelid and periocular area, sequela: Secondary | ICD-10-CM | POA: Diagnosis not present

## 2023-02-06 DIAGNOSIS — S0181XS Laceration without foreign body of other part of head, sequela: Secondary | ICD-10-CM | POA: Diagnosis not present

## 2023-02-06 DIAGNOSIS — Z01818 Encounter for other preprocedural examination: Secondary | ICD-10-CM | POA: Diagnosis not present

## 2023-02-06 DIAGNOSIS — S01112S Laceration without foreign body of left eyelid and periocular area, sequela: Secondary | ICD-10-CM | POA: Diagnosis not present

## 2023-02-10 ENCOUNTER — Ambulatory Visit: Payer: Medicare PPO | Admitting: Nurse Practitioner

## 2023-02-10 ENCOUNTER — Encounter: Payer: Self-pay | Admitting: Nurse Practitioner

## 2023-02-10 VITALS — BP 140/85 | HR 76 | Temp 97.6°F | Resp 20 | Ht 63.0 in | Wt 157.0 lb

## 2023-02-10 DIAGNOSIS — I10 Essential (primary) hypertension: Secondary | ICD-10-CM

## 2023-02-10 DIAGNOSIS — M810 Age-related osteoporosis without current pathological fracture: Secondary | ICD-10-CM

## 2023-02-10 DIAGNOSIS — F17210 Nicotine dependence, cigarettes, uncomplicated: Secondary | ICD-10-CM

## 2023-02-10 DIAGNOSIS — F17200 Nicotine dependence, unspecified, uncomplicated: Secondary | ICD-10-CM

## 2023-02-10 DIAGNOSIS — E785 Hyperlipidemia, unspecified: Secondary | ICD-10-CM

## 2023-02-10 MED ORDER — LISINOPRIL-HYDROCHLOROTHIAZIDE 20-12.5 MG PO TABS: 2.0000 | ORAL_TABLET | Freq: Every day | ORAL | 1 refills | Status: DC

## 2023-02-10 MED ORDER — ROSUVASTATIN CALCIUM 10 MG PO TABS: 10.0000 mg | ORAL_TABLET | Freq: Every day | ORAL | 1 refills | Status: DC

## 2023-02-10 NOTE — Progress Notes (Signed)
Subjective:    Patient ID: Lauren Dunn, female    DOB: 07-16-1950, 72 y.o.   MRN: [id number]   Chief Complaint: medical management of chronic issues     HPI:  Lauren Dunn is a 72 y.o. who identifies as a female who was assigned female at birth.   Social history: Lives with: by herself Work history: retired from [employer]   Comes in today for follow up of the following chronic medical issues:  1. Primary hypertension No c/o chest pain, sob or headache. Does not check blood pressure at home. BP Readings from Last 3 Encounters:  12/16/22 130/86  11/12/22 122/68  08/06/22 139/77     2. Hyperlipidemia with target LDL less than 100 Does try to watch diet but does no dedicated exercise. Lab Results  Component Value Date   CHOL 140 08/06/2022   HDL 48 08/06/2022   LDLCALC 62 08/06/2022   TRIG 179 (H) 08/06/2022   CHOLHDL 2.9 08/06/2022      3. Age-related osteoporosis without current pathological fracture No weight bearing exercises. Last dexascan was done on 07/30/21.  T score was -1.2. She is on prolia injections.  4. Smoker Smokes about a pack a day. Last low dose CT san was done on 04/06/22 which was normal.   New complaints: None today  Allergies  Allergen Reactions   Lipitor [Atorvastatin]     myalgia   Elemental Sulfur     Eye drop--- Inflammation to eyes   Outpatient Encounter Medications as of 02/10/2023  Medication Sig   Ascorbic Acid (VITAMIN C PO) Take by mouth.   BIOTIN PO Take by mouth.   CALCIUM PO Take by mouth.   cetirizine (ZYRTEC) 10 MG tablet Take 10 mg by mouth daily.   Cholecalciferol (VITAMIN D3 PO) Take by mouth.   Clobetasol Prop Emollient Base (CLOBETASOL PROPIONATE E) 0.05 % emollient cream Apply to affected area bid   cyclobenzaprine (FLEXERIL) 5 MG tablet Take 1 tablet (5 mg total) by mouth 3 (three) times daily as needed for muscle spasms.   denosumab (PROLIA) 60 MG/ML SOSY injection Inject 60 mg into the skin every 6 (six) months.    lisinopril-hydrochlorothiazide (ZESTORETIC) 20-12.5 MG tablet Take 2 tablets by mouth once daily   meloxicam (MOBIC) 7.5 MG tablet Take 1 tablet (7.5 mg total) by mouth daily.   Multiple Vitamin (MULTIVITAMIN) capsule Take 1 capsule by mouth daily.   rosuvastatin (CRESTOR) 10 MG tablet Take 1 tablet by mouth once daily   No facility-administered encounter medications on file as of 02/10/2023.    Past Surgical History:  Procedure Laterality Date   COLONOSCOPY  2019   2013, 2019   POLYPECTOMY     2019 last colon TA   WISDOM TOOTH EXTRACTION      Family History  Problem Relation Age of Onset   Stroke Mother    Hypertension Mother    Hypertension Sister    Hypertension Sister    Diabetes Sister    Diabetes Sister    Diabetes Brother    Diabetes Nephew    Colon cancer Neg Hx    Esophageal cancer Neg Hx    Stomach cancer Neg Hx    Rectal cancer Neg Hx    Colon polyps Neg Hx    Breast cancer Neg Hx       Controlled substance contract: n/a     Review of Systems  Constitutional:  Negative for diaphoresis.  Eyes:  Negative for pain.  Respiratory:  Negative for shortness of breath.   Cardiovascular:  Negative for chest pain, palpitations and leg swelling.  Gastrointestinal:  Negative for abdominal pain.  Endocrine: Negative for polydipsia.  Skin:  Negative for rash.  Neurological:  Negative for dizziness, weakness and headaches.  Hematological:  Does not bruise/bleed easily.  All other systems reviewed and are negative.      Objective:   Physical Exam Vitals and nursing note reviewed.  Constitutional:      General: She is not in acute distress.    Appearance: Normal appearance. She is well-developed.  HENT:     Head: Normocephalic.     Right Ear: Tympanic membrane normal.     Left Ear: Tympanic membrane normal.     Nose: Nose normal.     Mouth/Throat:     Mouth: Mucous membranes are moist.  Eyes:     Pupils: Pupils are equal, round, and reactive to light.   Neck:     Vascular: No carotid bruit or JVD.  Cardiovascular:     Rate and Rhythm: Normal rate and regular rhythm.     Heart sounds: Normal heart sounds.  Pulmonary:     Effort: Pulmonary effort is normal. No respiratory distress.     Breath sounds: Normal breath sounds. No wheezing or rales.  Chest:     Chest wall: No tenderness.  Abdominal:     General: Bowel sounds are normal. There is no distension or abdominal bruit.     Palpations: Abdomen is soft. There is no hepatomegaly, splenomegaly, mass or pulsatile mass.     Tenderness: There is no abdominal tenderness.  Musculoskeletal:        General: Normal range of motion.     Cervical back: Normal range of motion and neck supple.  Lymphadenopathy:     Cervical: No cervical adenopathy.  Skin:    General: Skin is warm and dry.  Neurological:     Mental Status: She is alert and oriented to person, place, and time.     Deep Tendon Reflexes: Reflexes are normal and symmetric.  Psychiatric:        Behavior: Behavior normal.        Thought Content: Thought content normal.        Judgment: Judgment normal.     BP (!) 140/85   Pulse 76   Temp 97.6 F (36.4 C) (Temporal)   Resp 20   Ht 5\' 3"  (1.6 m)   Wt 157 lb (71.2 kg)   SpO2 100%   BMI 27.81 kg/m         Assessment & Plan:   Lauren Dunn comes in today with chief complaint of Medical Management of Chronic Issues   Diagnosis and orders addressed:  1. Primary hypertension Low sodium diet - CBC with Differential/Platelet - CMP14+EGFR - lisinopril-hydrochlorothiazide (ZESTORETIC) 20-12.5 MG tablet; Take 2 tablets by mouth daily.  Dispense: 180 tablet; Refill: 1  2. Hyperlipidemia with target LDL less than 100 Low fat diet - Lipid panel - rosuvastatin (CRESTOR) 10 MG tablet; Take 1 tablet (10 mg total) by mouth daily.  Dispense: 90 tablet; Refill: 1  3. Age-related osteoporosis without current pathological fracture Weight bearing exercises  4. Smoker Smoking  cessation encouraged.   Labs pending Health Maintenance reviewed Diet and exercise encouraged  Follow up plan: 6 months   Mary-Margaret Daphine Deutscher, FNP

## 2023-02-10 NOTE — Addendum Note (Signed)
Addended by: Bennie Pierini on: 02/10/2023 09:30 AM   Modules accepted: Level of Service

## 2023-02-11 ENCOUNTER — Encounter: Payer: Self-pay | Admitting: Dermatology

## 2023-02-11 ENCOUNTER — Ambulatory Visit (INDEPENDENT_AMBULATORY_CARE_PROVIDER_SITE_OTHER): Payer: Medicare PPO | Admitting: Dermatology

## 2023-02-11 VITALS — BP 136/72 | HR 79

## 2023-02-11 DIAGNOSIS — D229 Melanocytic nevi, unspecified: Secondary | ICD-10-CM

## 2023-02-11 DIAGNOSIS — Z1283 Encounter for screening for malignant neoplasm of skin: Secondary | ICD-10-CM | POA: Diagnosis not present

## 2023-02-11 DIAGNOSIS — D1801 Hemangioma of skin and subcutaneous tissue: Secondary | ICD-10-CM | POA: Diagnosis not present

## 2023-02-11 DIAGNOSIS — W908XXA Exposure to other nonionizing radiation, initial encounter: Secondary | ICD-10-CM | POA: Diagnosis not present

## 2023-02-11 DIAGNOSIS — D485 Neoplasm of uncertain behavior of skin: Secondary | ICD-10-CM

## 2023-02-11 DIAGNOSIS — L821 Other seborrheic keratosis: Secondary | ICD-10-CM

## 2023-02-11 DIAGNOSIS — L570 Actinic keratosis: Secondary | ICD-10-CM | POA: Diagnosis not present

## 2023-02-11 DIAGNOSIS — L578 Other skin changes due to chronic exposure to nonionizing radiation: Secondary | ICD-10-CM

## 2023-02-11 DIAGNOSIS — L814 Other melanin hyperpigmentation: Secondary | ICD-10-CM

## 2023-02-11 LAB — CMP14+EGFR
ALT: 18 IU/L (ref 0–32)
AST: 24 IU/L (ref 0–40)
Albumin: 4.7 g/dL (ref 3.8–4.8)
Alkaline Phosphatase: 44 IU/L (ref 44–121)
BUN/Creatinine Ratio: 20 (ref 12–28)
BUN: 16 mg/dL (ref 8–27)
Bilirubin Total: 0.3 mg/dL (ref 0.0–1.2)
CO2: 23 mmol/L (ref 20–29)
Calcium: 9.5 mg/dL (ref 8.7–10.3)
Chloride: 100 mmol/L (ref 96–106)
Creatinine, Ser: 0.82 mg/dL (ref 0.57–1.00)
Globulin, Total: 2 g/dL (ref 1.5–4.5)
Glucose: 99 mg/dL (ref 70–99)
Potassium: 4.5 mmol/L (ref 3.5–5.2)
Sodium: 140 mmol/L (ref 134–144)
Total Protein: 6.7 g/dL (ref 6.0–8.5)
eGFR: 75 mL/min/{1.73_m2} (ref >59)

## 2023-02-11 LAB — CBC WITH DIFFERENTIAL/PLATELET
Basophils Absolute: 0.1 10*3/uL (ref 0.0–0.2)
Basos: 1 %
EOS (ABSOLUTE): 0.2 10*3/uL (ref 0.0–0.4)
Eos: 2 %
Hematocrit: 39.8 % (ref 34.0–46.6)
Hemoglobin: 13.1 g/dL (ref 11.1–15.9)
Immature Grans (Abs): 0 10*3/uL (ref 0.0–0.1)
Immature Granulocytes: 0 %
Lymphocytes Absolute: 1.7 10*3/uL (ref 0.7–3.1)
Lymphs: 24 %
MCH: 30.8 pg (ref 26.6–33.0)
MCHC: 32.9 g/dL (ref 31.5–35.7)
MCV: 93 fL (ref 79–97)
Monocytes Absolute: 0.7 10*3/uL (ref 0.1–0.9)
Monocytes: 9 %
Neutrophils Absolute: 4.5 10*3/uL (ref 1.4–7.0)
Neutrophils: 64 %
Platelets: 294 10*3/uL (ref 150–450)
RBC: 4.26 x10E6/uL (ref 3.77–5.28)
RDW: 13 % (ref 11.7–15.4)
WBC: 7.1 10*3/uL (ref 3.4–10.8)

## 2023-02-11 LAB — LIPID PANEL
Chol/HDL Ratio: 3.4 ratio (ref 0.0–4.4)
Cholesterol, Total: 151 mg/dL (ref 100–199)
HDL: 45 mg/dL (ref >39)
LDL Chol Calc (NIH): 76 mg/dL (ref 0–99)
Triglycerides: 175 mg/dL — ABNORMAL HIGH (ref 0–149)
VLDL Cholesterol Cal: 30 mg/dL (ref 5–40)

## 2023-02-11 NOTE — Progress Notes (Signed)
   Follow-Up Visit   Subjective  Lauren Dunn is a 72 y.o. female who presents for the following: Skin Cancer Screening and Full Body Skin Exam  The patient presents for Total-Body Skin Exam (TBSE) for skin cancer screening and mole check. The patient has spots, moles and lesions to be evaluated, some may be new or changing and the patient may have concern these could be cancer. Pt had a BCC under right eye in July 2024, she is scheduled for Mohs with Dr. Jeannine Boga in October Pt has family hx of NMSC  The following portions of the chart were reviewed this encounter and updated as appropriate: medications, allergies, medical history  Review of Systems:  No other skin or systemic complaints except as noted in HPI or Assessment and Plan.  Objective  Well appearing patient in no apparent distress; mood and affect are within normal limits.  A full examination was performed including scalp, head, eyes, ears, nose, lips, neck, chest, axillae, abdomen, back, buttocks, bilateral upper extremities, bilateral lower extremities, hands, feet, fingers, toes, fingernails, and toenails. All findings within normal limits unless otherwise noted below.   Relevant physical exam findings are noted in the Assessment and Plan.  Left Shoulder - Anterior Irregular brown macule    Assessment & Plan   SKIN CANCER SCREENING PERFORMED TODAY.  ACTINIC DAMAGE - Chronic condition, secondary to cumulative UV/sun exposure - diffuse scaly erythematous macules with underlying dyspigmentation - Recommend daily broad spectrum sunscreen SPF 30+ to sun-exposed areas, reapply every 2 hours as needed.  - Staying in the shade or wearing long sleeves, sun glasses (UVA+UVB protection) and wide brim hats (4-inch brim around the entire circumference of the hat) are also recommended for sun protection.  - Call for new or changing lesions.  MELANOCYTIC NEVI - Tan-brown and/or pink-flesh-colored symmetric macules and papules -  Benign appearing on exam today - Observation - Call clinic for new or changing moles - Recommend daily use of broad spectrum spf 30+ sunscreen to sun-exposed areas.   SEBORRHEIC KERATOSIS - Stuck-on, waxy, tan-brown papules and/or plaques  - Benign-appearing - Discussed benign etiology and prognosis. - Observe - Call for any changes  LENTIGINES Exam: scattered tan macules Due to sun exposure Treatment Plan: Benign-appearing, observe. Recommend daily broad spectrum sunscreen SPF 30+ to sun-exposed areas, reapply every 2 hours as needed.  Call for any changes   CHERRY ANGIOMA Exam: red papule(s) Discussed benign nature. Recommend observation. Call for changes.   AK (actinic keratosis) Mid [place] Lip  Destruction of lesion - Mid [place] Lip  Destruction method: cryotherapy   Lesion destroyed using liquid nitrogen: Yes   Region frozen until ice ball extended beyond lesion: Yes    If lesion is still present at follow up, recommended biopsy  Neoplasm of uncertain behavior of skin Left Shoulder - Anterior  Skin / nail biopsy Type of biopsy: tangential   Timeout: patient name, date of birth, surgical site, and procedure verified   Instrument used: DermaBlade   Post-procedure details: wound care instructions given    Specimen 1 - Surgical pathology Differential Diagnosis: R/O NMSC  Check Margins: No   Return in about 6 months (around 08/11/2023) for TBSE.  I, Tillie Fantasia, CMA, am acting as scribe for Gwenith Daily, MD.   Documentation: I have reviewed the above documentation for accuracy and completeness, and I agree with the above.  Gwenith Daily, MD

## 2023-02-11 NOTE — Patient Instructions (Addendum)

## 2023-02-12 MED ORDER — DENOSUMAB 60 MG/ML ~~LOC~~ SOSY
60.0000 mg | PREFILLED_SYRINGE | Freq: Once | SUBCUTANEOUS | Status: AC
  Administered 2023-02-10: 60 mg via SUBCUTANEOUS

## 2023-02-12 NOTE — Addendum Note (Signed)
Addended by: Cleda Daub on: 02/12/2023 07:24 AM   Modules accepted: Orders

## 2023-02-13 LAB — SURGICAL PATHOLOGY

## 2023-02-25 DIAGNOSIS — C44319 Basal cell carcinoma of skin of other parts of face: Secondary | ICD-10-CM | POA: Diagnosis not present

## 2023-02-26 DIAGNOSIS — S01411A Laceration without foreign body of right cheek and temporomandibular area, initial encounter: Secondary | ICD-10-CM | POA: Diagnosis not present

## 2023-02-26 DIAGNOSIS — Z483 Aftercare following surgery for neoplasm: Secondary | ICD-10-CM | POA: Diagnosis not present

## 2023-02-26 DIAGNOSIS — S01111A Laceration without foreign body of right eyelid and periocular area, initial encounter: Secondary | ICD-10-CM | POA: Diagnosis not present

## 2023-02-26 DIAGNOSIS — Z85828 Personal history of other malignant neoplasm of skin: Secondary | ICD-10-CM | POA: Diagnosis not present

## 2023-02-26 DIAGNOSIS — C441122 Basal cell carcinoma of skin of right lower eyelid, including canthus: Secondary | ICD-10-CM | POA: Diagnosis not present

## 2023-02-26 DIAGNOSIS — Z481 Encounter for planned postprocedural wound closure: Secondary | ICD-10-CM | POA: Diagnosis not present

## 2023-03-25 ENCOUNTER — Other Ambulatory Visit: Payer: Self-pay | Admitting: Nurse Practitioner

## 2023-03-25 DIAGNOSIS — Z1231 Encounter for screening mammogram for malignant neoplasm of breast: Secondary | ICD-10-CM

## 2023-04-09 DIAGNOSIS — C4491 Basal cell carcinoma of skin, unspecified: Secondary | ICD-10-CM | POA: Insufficient documentation

## 2023-04-10 ENCOUNTER — Other Ambulatory Visit: Payer: Self-pay

## 2023-04-10 DIAGNOSIS — F17200 Nicotine dependence, unspecified, uncomplicated: Secondary | ICD-10-CM

## 2023-05-27 NOTE — Progress Notes (Signed)
 Attempted to reach patient regarding follow-up LDCT. Unable to reach patient directly. Detailed VM left asking that the patient return my call.

## 2023-06-11 NOTE — Progress Notes (Signed)
Attempted to reach patient to schedule follow-up LDCT. Unable to reach patient directly. Detailed VM left asking that the patient return my call.

## 2023-06-27 NOTE — Progress Notes (Signed)
Attempted to reach patient to schedule follow-up LDCT. Unable to reach patient directly. Detailed VM left asking that the patient return my call.

## 2023-07-22 ENCOUNTER — Telehealth: Payer: Self-pay

## 2023-07-22 DIAGNOSIS — M810 Age-related osteoporosis without current pathological fracture: Secondary | ICD-10-CM

## 2023-07-22 MED ORDER — DENOSUMAB 60 MG/ML ~~LOC~~ SOSY
60.0000 mg | PREFILLED_SYRINGE | Freq: Once | SUBCUTANEOUS | Status: AC
  Administered 2023-08-11: 60 mg via SUBCUTANEOUS

## 2023-07-22 NOTE — Telephone Encounter (Signed)
 Prolia ordered for PA team to review verification benefits

## 2023-07-23 ENCOUNTER — Other Ambulatory Visit (HOSPITAL_COMMUNITY): Payer: Self-pay

## 2023-07-23 ENCOUNTER — Telehealth: Payer: Self-pay

## 2023-07-23 NOTE — Telephone Encounter (Signed)
 Prolia VOB initiated via [email or web address]  Next Prolia inj DUE: 08/11/23

## 2023-07-24 ENCOUNTER — Other Ambulatory Visit (HOSPITAL_COMMUNITY): Payer: Self-pay

## 2023-07-24 NOTE — Telephone Encounter (Signed)
 Pt ready for scheduling for PROLIA on or after : 08/11/23  Option# 1: Buy/Bill (Office supplied medication)  Out-of-pocket cost due at time of clinic visit: $40  Number of injection/visits approved: 2  Primary: [employer] Prolia co-insurance: $40 Admin fee co-insurance: 0%  Secondary: --- Prolia co-insurance:  Admin fee co-insurance:   Medical Benefit Details: Date Benefits were checked: 07/23/23 Deductible: NO/ Coinsurance: $40/ Admin Fee: 0%  Prior Auth: APPROVED PA# [id number] Expiration Date: 05/28/23-05/26/24  # of doses approved: 2 ----------------------------------------------------------------------- Option# 2- Med Obtained from pharmacy:  Pharmacy benefit: Copay $64 (Paid to pharmacy) Admin Fee: 0% (Pay at clinic)  Prior Auth: N/A PA# Expiration Date:   # of doses approved:   If patient wants fill through the pharmacy benefit please send prescription to: [employer], and include estimated need by date in rx notes. Pharmacy will ship medication directly to the office.  Patient NOT eligible for Prolia Copay Card. Copay Card can make patient's cost as little as $25. Link to apply: [email or web address]://[email or web address]  ** This summary of benefits is an estimation of the patient's out-of-pocket cost. Exact cost may very based on individual plan coverage.

## 2023-07-24 NOTE — Telephone Encounter (Signed)
 Lauren Dunn

## 2023-08-08 ENCOUNTER — Ambulatory Visit: Payer: Medicare PPO | Admitting: Nurse Practitioner

## 2023-08-11 ENCOUNTER — Ambulatory Visit: Admitting: Nurse Practitioner

## 2023-08-11 ENCOUNTER — Ambulatory Visit (INDEPENDENT_AMBULATORY_CARE_PROVIDER_SITE_OTHER)

## 2023-08-11 VITALS — BP 139/89 | HR 89 | Temp 97.6°F | Ht 63.0 in | Wt 166.2 lb

## 2023-08-11 DIAGNOSIS — Z0001 Encounter for general adult medical examination with abnormal findings: Secondary | ICD-10-CM | POA: Diagnosis not present

## 2023-08-11 DIAGNOSIS — E785 Hyperlipidemia, unspecified: Secondary | ICD-10-CM | POA: Diagnosis not present

## 2023-08-11 DIAGNOSIS — F17200 Nicotine dependence, unspecified, uncomplicated: Secondary | ICD-10-CM | POA: Diagnosis not present

## 2023-08-11 DIAGNOSIS — Z780 Asymptomatic menopausal state: Secondary | ICD-10-CM | POA: Diagnosis not present

## 2023-08-11 DIAGNOSIS — M810 Age-related osteoporosis without current pathological fracture: Secondary | ICD-10-CM

## 2023-08-11 DIAGNOSIS — M8589 Other specified disorders of bone density and structure, multiple sites: Secondary | ICD-10-CM | POA: Diagnosis not present

## 2023-08-11 DIAGNOSIS — F5101 Primary insomnia: Secondary | ICD-10-CM | POA: Diagnosis not present

## 2023-08-11 DIAGNOSIS — Z0000 Encounter for general adult medical examination without abnormal findings: Secondary | ICD-10-CM | POA: Diagnosis not present

## 2023-08-11 DIAGNOSIS — Z6828 Body mass index (BMI) 28.0-28.9, adult: Secondary | ICD-10-CM

## 2023-08-11 DIAGNOSIS — F339 Major depressive disorder, recurrent, unspecified: Secondary | ICD-10-CM

## 2023-08-11 DIAGNOSIS — I10 Essential (primary) hypertension: Secondary | ICD-10-CM | POA: Diagnosis not present

## 2023-08-11 MED ORDER — SERTRALINE HCL 100 MG PO TABS: 100.0000 mg | ORAL_TABLET | Freq: Every day | ORAL | 5 refills | Status: DC

## 2023-08-11 MED ORDER — ZOLPIDEM TARTRATE 10 MG PO TABS: 10.0000 mg | ORAL_TABLET | Freq: Every evening | ORAL | 5 refills | Status: AC | PRN

## 2023-08-11 NOTE — Progress Notes (Signed)
 Attempted to reach patient regarding follow-up LDCT. Unable to reach patient directly. Detailed VM left asking that the patient return my call.

## 2023-08-11 NOTE — Progress Notes (Signed)
 Subjective:    Patient ID: Lauren Dunn, female    DOB: 1951-05-27, 73 y.o.   MRN: [id number]   Chief Complaint: annual physical   HPI:  Lauren Dunn is a 73 y.o. who identifies as a female who was assigned female at birth.   Social history: Lives with: by hrself Work history: retired from [employer]   Comes in today for follow up of the following chronic medical issues:  1. Annual physical exam   2. Primary hypertension No c/o chest pain, sob or headache. Does not check blood pressure at home. BP Readings from Last 3 Encounters:  02/11/23 136/72  02/10/23 (!) 140/85  12/16/22 130/86     3. Hyperlipidemia with target LDL less than 100 Does try to watch diet and tries to stay very active. Lab Results  Component Value Date   CHOL 151 02/10/2023   HDL 45 02/10/2023   LDLCALC 76 02/10/2023   TRIG 175 (H) 02/10/2023   CHOLHDL 3.4 02/10/2023   The 10-year ASCVD risk score (Arnett DK, et al., 2019) is: 27.1%   4. Age-related osteoporosis without current pathological fracture Last dexascan was done on 07/30/21. T score was -1.2. shei s currently on prolia injections.  5. Smoker Smokes 1/2 -1 pack a day  6. BMI 28.0-28.9,adult Weight is up 9lbs  Wt Readings from Last 3 Encounters:  08/11/23 166 lb 3.2 oz (75.4 kg)  02/10/23 157 lb (71.2 kg)  11/12/22 157 lb 3.2 oz (71.3 kg)   BMI Readings from Last 3 Encounters:  08/11/23 29.44 kg/m  02/10/23 27.81 kg/m  11/12/22 27.85 kg/m       New complaints: - has had some increasing depression. She use to be on zoloft and wants to start again    02/10/2023    9:06 AM 11/12/2022   11:13 AM 08/06/2022   10:24 AM  Depression screen PHQ 2/9  Decreased Interest 0 0 1  Down, Depressed, Hopeless 1 0 1  PHQ - 2 Score 1 0 2  Altered sleeping 1 1 1   Tired, decreased energy 1 1 0  Change in appetite 0 0 0  Feeling bad or failure about yourself  0 0 0  Trouble concentrating 0 0 0  Moving slowly or fidgety/restless 0 0 0   Suicidal thoughts 0 0 0  PHQ-9 Score 3 2 3   Difficult doing work/chores Not difficult at all Not difficult at all Not difficult at all    - trouble falling asleep and staying asleep. She has used all OTC meds that dont seem to work anymore. Allergies  Allergen Reactions   Lipitor [Atorvastatin]     myalgia   Elemental Sulfur     Eye drop--- Inflammation to eyes   Outpatient Encounter Medications as of 08/11/2023  Medication Sig   Ascorbic Acid (VITAMIN C PO) Take by mouth.   BIOTIN PO Take by mouth.   CALCIUM PO Take by mouth.   cetirizine (ZYRTEC) 10 MG tablet Take 10 mg by mouth daily.   Cholecalciferol (VITAMIN D3 PO) Take by mouth.   Clobetasol Prop Emollient Base (CLOBETASOL PROPIONATE E) 0.05 % emollient cream Apply to affected area bid   denosumab (PROLIA) 60 MG/ML SOSY injection Inject 60 mg into the skin every 6 (six) months.   lisinopril-hydrochlorothiazide (ZESTORETIC) 20-12.5 MG tablet Take 2 tablets by mouth daily.   Multiple Vitamin (MULTIVITAMIN) capsule Take 1 capsule by mouth daily.   rosuvastatin (CRESTOR) 10 MG tablet Take 1 tablet (10 mg total)  by mouth daily.   Facility-Administered Encounter Medications as of 08/11/2023  Medication   denosumab (PROLIA) injection 60 mg    Past Surgical History:  Procedure Laterality Date   COLONOSCOPY  2019   2013, 2019   POLYPECTOMY     2019 last colon TA   WISDOM TOOTH EXTRACTION      Family History  Problem Relation Age of Onset   Stroke Mother    Hypertension Mother    Hypertension Sister    Hypertension Sister    Diabetes Sister    Diabetes Sister    Diabetes Brother    Diabetes Nephew    Colon cancer Neg Hx    Esophageal cancer Neg Hx    Stomach cancer Neg Hx    Rectal cancer Neg Hx    Colon polyps Neg Hx    Breast cancer Neg Hx       Controlled substance contract: n/a      Review of Systems  Constitutional:  Negative for diaphoresis.  Eyes:  Negative for pain.  Respiratory:  Negative for  shortness of breath.   Cardiovascular:  Negative for chest pain, palpitations and leg swelling.  Gastrointestinal:  Negative for abdominal pain.  Endocrine: Negative for polydipsia.  Skin:  Negative for rash.  Neurological:  Negative for dizziness, weakness and headaches.  Hematological:  Does not bruise/bleed easily.  All other systems reviewed and are negative.      Objective:   Physical Exam Vitals and nursing note reviewed.  Constitutional:      General: She is not in acute distress.    Appearance: Normal appearance. She is well-developed.  HENT:     Head: Normocephalic.     Right Ear: Tympanic membrane normal.     Left Ear: Tympanic membrane normal.     Nose: Nose normal.     Mouth/Throat:     Mouth: Mucous membranes are moist.  Eyes:     Pupils: Pupils are equal, round, and reactive to light.  Neck:     Vascular: No carotid bruit or JVD.  Cardiovascular:     Rate and Rhythm: Normal rate and regular rhythm.     Heart sounds: Normal heart sounds.  Pulmonary:     Effort: Pulmonary effort is normal. No respiratory distress.     Breath sounds: Normal breath sounds. No wheezing or rales.  Chest:     Chest wall: No tenderness.  Abdominal:     General: Bowel sounds are normal. There is no distension or abdominal bruit.     Palpations: Abdomen is soft. There is no hepatomegaly, splenomegaly, mass or pulsatile mass.     Tenderness: There is no abdominal tenderness.  Musculoskeletal:        General: Normal range of motion.     Cervical back: Normal range of motion and neck supple.  Lymphadenopathy:     Cervical: No cervical adenopathy.  Skin:    General: Skin is warm and dry.  Neurological:     Mental Status: She is alert and oriented to person, place, and time.     Deep Tendon Reflexes: Reflexes are normal and symmetric.  Psychiatric:        Behavior: Behavior normal.        Thought Content: Thought content normal.        Judgment: Judgment normal.    BP 139/89    Pulse 89   Temp 97.6 F (36.4 C) (Skin)   Ht 5\' 3"  (1.6 m)   Wt 166  lb 3.2 oz (75.4 kg)   BMI 29.44 kg/m          Assessment & Plan:  Lauren Dunn comes in today with chief complaint of medical management of chronic issues    Diagnosis and orders addressed:  1. Annual physical exam  2. Primary hypertension Low sodium diet - EKG 12-Lead - lisinopril-hydrochlorothiazide (ZESTORETIC) 20-12.5 MG tablet; Take 2 tablets by mouth daily.  Dispense: 180 tablet; Refill: 1  3. Hyperlipidemia with target LDL less than 100 Low fta diet - rosuvastatin (CRESTOR) 10 MG tablet; Take 1 tablet (10 mg total) by mouth daily.  Dispense: 90 tablet; Refill: 1  4. Age-related osteoporosis without current pathological fracture Dexascan pending  5. Smoker Smoking cessation encouraged  6. BMI 28.0-28.9,adult Discussed diet and exercise for person with BMI >25 Will recheck weight in 3-6 months  7. insomnia Bedtime routine Ambien 10mg  1/2-1 po at bedtime #30 5 refills  8. Depression Stress management Zoloft 100mg  1 po daily #30 5 refills  Labs pending Health Maintenance reviewed Diet and exercise encouraged  Follow up plan: 6 months   Mary-Margaret Daphine Deutscher, FNP

## 2023-08-11 NOTE — Patient Instructions (Signed)
 Bone Health Bones protect organs, store calcium, anchor muscles, and support the whole body. Keeping your bones strong is important, especially as you get older. You can take actions to help keep your bones strong and healthy. Why is keeping my bones healthy important?  Keeping your bones healthy is important because your body constantly replaces bone cells. Cells get old, and new cells take their place. As we age, we lose bone cells because the body may not be able to make enough new cells to replace the old cells. The amount of bone cells and bone tissue you have is referred to as bone mass. The higher your bone mass, the stronger your bones. The aging process leads to an overall loss of bone mass in the body, which can increase the likelihood of: Broken bones. A condition in which the bones become weak and brittle (osteoporosis). A large decline in bone mass occurs in older adults. In women, it occurs about the time of menopause. What actions can I take to keep my bones healthy? Good health habits are important for maintaining healthy bones. This includes eating nutritious foods and exercising regularly. To have healthy bones, you need to get enough of the right minerals and vitamins. Most nutrition experts recommend getting these nutrients from the foods that you eat. In some cases, taking supplements may also be recommended. Doing certain types of exercise is also important for bone health. What are the nutritional recommendations for healthy bones?  Eating a well-balanced diet with plenty of calcium and vitamin D will help to protect your bones. Nutritional recommendations vary from person to person. Ask your health care provider what is healthy for you. Here are some general guidelines. Get enough calcium Calcium is the most important (essential) mineral for bone health. Most people can get enough calcium from their diet, but supplements may be recommended for people who are at risk for  osteoporosis. Good sources of calcium include: Dairy products, such as low-fat or nonfat milk, cheese, and yogurt. Dark green leafy vegetables, such as bok choy and broccoli. Foods that have calcium added to them (are fortified). Foods that may be fortified with calcium include orange juice, cereal, bread, soy beverages, and tofu products. Nuts, such as almonds. Follow these recommended amounts for daily calcium intake: Infants, 0-6 months: 200 mg. Infants, 6-12 months: 260 mg. Children, age 647-3: 700 mg. Children, age 64-8: 1,000 mg. Children, age 642-13: 1,300 mg. Teens, age 38-18: 1,300 mg. Adults, age 74-50: 1,000 mg. Adults, age 23-70: Men: 1,000 mg. Women: 1,200 mg. Adults, age 97 or older: 1,200 mg. Pregnant and breastfeeding females: Teens: 1,300 mg. Adults: 1,000 mg. Get enough vitamin D Vitamin D is the most essential vitamin for bone health. It helps the body absorb calcium. Sunlight stimulates the skin to make vitamin D, so be sure to get enough sunlight. If you live in a cold climate or you do not get outside often, your health care provider may recommend that you take vitamin D supplements. Good sources of vitamin D in your diet include: Egg yolks. Saltwater fish. Milk and cereal fortified with vitamin D. Follow these recommended amounts for daily vitamin D intake: Infants, 0-12 months: 400 international units (IU). Children and teens, age 647-18: 600 international units. Adults, age 31 or younger: 600 international units. Adults, age 9 or older: 600-1,000 international units. Get other important nutrients Other nutrients that are important for bone health include: Phosphorus. This mineral is found in meat, poultry, dairy foods, nuts, and legumes. The  recommended daily intake for adult men and adult women is 700 mg. Magnesium. This mineral is found in seeds, nuts, dark green vegetables, and legumes. The recommended daily intake for adult men is 400-420 mg. For adult women,  it is 310-320 mg. Vitamin K. This vitamin is found in green leafy vegetables. The recommended daily intake is 120 mcg for adult men and 90 mcg for adult women. What type of physical activity is best for building and maintaining healthy bones? Weight-bearing and strength-building activities are important for building and maintaining healthy bones. Weight-bearing activities cause muscles and bones to work against gravity. Strength-building activities increase the strength of the muscles that support bones. Weight-bearing and muscle-building activities include: Walking and hiking. Jogging and running. Dancing. Gym exercises. Lifting weights. Tennis and racquetball. Climbing stairs. Aerobics. Adults should get at least 30 minutes of moderate physical activity on most days. Children should get at least 60 minutes of moderate physical activity on most days. Ask your health care provider what type of exercise is best for you. How can I find out if my bone mass is low? Bone mass can be measured with an X-ray test called a bone mineral density (BMD) test. This test is recommended for all women who are age 19 or older. It may also be recommended for: Men who are age 55 or older. People who are at risk for osteoporosis because of: Having a long-term disease that weakens bones, such as kidney disease or rheumatoid arthritis. Having menopause earlier than normal. Taking medicine that weakens bones, such as steroids, thyroid hormones, or hormone treatment for breast cancer or prostate cancer. Smoking. Drinking three or more alcoholic drinks a day. Being underweight. Sedentary lifestyle. If you find that you have a low bone mass, you may be able to prevent osteoporosis or further bone loss by changing your diet and lifestyle. Where can I find more information? Bone Health & Osteoporosis Foundation: https://carlson-fletcher.info/ [employer]: www.bones.http://www.myers.net/ International Osteoporosis  Foundation: Investment banker, operational.iofbonehealth.org Summary The aging process leads to an overall loss of bone mass in the body, which can increase the likelihood of broken bones and osteoporosis. Eating a well-balanced diet with plenty of calcium and vitamin D will help to protect your bones. Weight-bearing and strength-building activities are also important for building and maintaining strong bones. Bone mass can be measured with an X-ray test called a bone mineral density (BMD) test. This information is not intended to replace advice given to you by your health care provider. Make sure you discuss any questions you have with your health care provider. Document Revised: 10/25/2020 Document Reviewed: 10/25/2020 Elsevier Patient Education  2024 [employer].

## 2023-08-12 LAB — CBC WITH DIFFERENTIAL/PLATELET
Basophils Absolute: 0.1 10*3/uL (ref 0.0–0.2)
Basos: 1 %
EOS (ABSOLUTE): 0.1 10*3/uL (ref 0.0–0.4)
Eos: 2 %
Hematocrit: 36.9 % (ref 34.0–46.6)
Hemoglobin: 12.6 g/dL (ref 11.1–15.9)
Immature Grans (Abs): 0 10*3/uL (ref 0.0–0.1)
Immature Granulocytes: 0 %
Lymphocytes Absolute: 2.3 10*3/uL (ref 0.7–3.1)
Lymphs: 28 %
MCH: 32 pg (ref 26.6–33.0)
MCHC: 34.1 g/dL (ref 31.5–35.7)
MCV: 94 fL (ref 79–97)
Monocytes Absolute: 0.6 10*3/uL (ref 0.1–0.9)
Monocytes: 8 %
Neutrophils Absolute: 4.9 10*3/uL (ref 1.4–7.0)
Neutrophils: 61 %
Platelets: 263 10*3/uL (ref 150–450)
RBC: 3.94 x10E6/uL (ref 3.77–5.28)
RDW: 13 % (ref 11.7–15.4)
WBC: 8 10*3/uL (ref 3.4–10.8)

## 2023-08-12 LAB — CMP14+EGFR
ALT: 21 IU/L (ref 0–32)
AST: 26 IU/L (ref 0–40)
Albumin: 4.7 g/dL (ref 3.8–4.8)
Alkaline Phosphatase: 44 IU/L (ref 44–121)
BUN/Creatinine Ratio: 19 (ref 12–28)
BUN: 16 mg/dL (ref 8–27)
Bilirubin Total: 0.4 mg/dL (ref 0.0–1.2)
CO2: 24 mmol/L (ref 20–29)
Calcium: 9.9 mg/dL (ref 8.7–10.3)
Chloride: 98 mmol/L (ref 96–106)
Creatinine, Ser: 0.85 mg/dL (ref 0.57–1.00)
Globulin, Total: 2.2 g/dL (ref 1.5–4.5)
Glucose: 76 mg/dL (ref 70–99)
Potassium: 4.2 mmol/L (ref 3.5–5.2)
Sodium: 135 mmol/L (ref 134–144)
Total Protein: 6.9 g/dL (ref 6.0–8.5)
eGFR: 72 mL/min/{1.73_m2} (ref >59)

## 2023-08-12 LAB — VITAMIN D 25 HYDROXY (VIT D DEFICIENCY, FRACTURES): Vit D, 25-Hydroxy: 30.8 ng/mL (ref 30.0–100.0)

## 2023-08-12 LAB — THYROID PANEL WITH TSH
Free Thyroxine Index: 2.2 (ref 1.2–4.9)
T3 Uptake Ratio: 23 % — ABNORMAL LOW (ref 24–39)
T4, Total: 9.5 ug/dL (ref 4.5–12.0)
TSH: 1.73 u[IU]/mL (ref 0.450–4.500)

## 2023-08-12 LAB — LIPID PANEL
Chol/HDL Ratio: 3.5 ratio (ref 0.0–4.4)
Cholesterol, Total: 161 mg/dL (ref 100–199)
HDL: 46 mg/dL (ref >39)
LDL Chol Calc (NIH): 80 mg/dL (ref 0–99)
Triglycerides: 207 mg/dL — ABNORMAL HIGH (ref 0–149)
VLDL Cholesterol Cal: 35 mg/dL (ref 5–40)

## 2023-08-19 ENCOUNTER — Encounter: Payer: Self-pay | Admitting: Dermatology

## 2023-08-19 ENCOUNTER — Ambulatory Visit: Admitting: Dermatology

## 2023-08-19 VITALS — BP 126/79 | HR 90

## 2023-08-19 DIAGNOSIS — W908XXD Exposure to other nonionizing radiation, subsequent encounter: Secondary | ICD-10-CM

## 2023-08-19 DIAGNOSIS — L821 Other seborrheic keratosis: Secondary | ICD-10-CM | POA: Diagnosis not present

## 2023-08-19 DIAGNOSIS — L570 Actinic keratosis: Secondary | ICD-10-CM

## 2023-08-19 NOTE — Progress Notes (Signed)
   Follow-Up Visit   Subjective  Lauren Dunn is a 73 y.o. female who presents for the following: Follow up after treatment for an AK cryoed on her lower  lip and check biopsy spot, from an SK on her left shoulder.  The patient has spots, moles and lesions to be evaluated, some may be new or changing.  The following portions of the chart were reviewed this encounter and updated as appropriate: medications, allergies, medical history  Review of Systems:  No other skin or systemic complaints except as noted in HPI or Assessment and Plan.  Objective  Well appearing patient in no apparent distress; mood and affect are within normal limits.   A focused examination was performed of the following areas:  Left shoulder and lower lip  Relevant exam findings are noted in the Assessment and Plan.    Assessment & Plan   ACTINIC KERATOSIS Exam: Erythematous thin papules/macules with gritty scale at the lower lip, resolved after cryotherapy Scaling on the nasal dorsum, will monitor and recheck and next appointment  Actinic keratoses are precancerous spots that appear secondary to cumulative UV radiation exposure/sun exposure over time. They are chronic with expected duration over 1 year. A portion of actinic keratoses will progress to squamous cell carcinoma of the skin. It is not possible to reliably predict which spots will progress to skin cancer and so treatment is recommended to prevent development of skin cancer.  Recommend daily broad spectrum sunscreen SPF 30+ to sun-exposed areas, reapply every 2 hours as needed.  Recommend staying in the shade or wearing long sleeves, sun glasses (UVA+UVB protection) and wide brim hats (4-inch brim around the entire circumference of the hat). Call for new or changing lesions.  Treatment Plan: - Monitor for changes  SEBORRHEIC KERATOSIS - Stuck-on, waxy, tan-brown papules and/or plaques  - Benign-appearing - Discussed benign etiology and  prognosis. - Observe - Call for any changes  AK (ACTINIC KERATOSIS) Mid Lower Vermilion Lip Previous treated with LN2. Spot is gone and no more treatment is needed at this time, SEBORRHEIC KERATOSIS Right Zygomatic Area Doesn't bother patient at this time. No treatment needed.  Return in about 2 months (around 10/19/2023) for TBSC.  I, Manual Meier, Surg Tech III, am acting as scribe for Gwenith Daily, MD.   Documentation: I have reviewed the above documentation for accuracy and completeness, and I agree with the above.  Gwenith Daily, MD

## 2023-08-19 NOTE — Patient Instructions (Signed)

## 2023-08-20 ENCOUNTER — Ambulatory Visit
Admission: RE | Admit: 2023-08-20 | Discharge: 2023-08-20 | Disposition: A | Discharge status: Home / Self Care | Source: Ambulatory Visit | Attending: Nurse Practitioner | Admitting: Nurse Practitioner

## 2023-08-20 DIAGNOSIS — Z1231 Encounter for screening mammogram for malignant neoplasm of breast: Secondary | ICD-10-CM | POA: Diagnosis not present

## 2023-08-29 NOTE — Progress Notes (Signed)
 Attempted to reach patient regarding follow-up LDCT. Unable to reach patient directly. Detailed VM left asking that the patient return my call.

## 2023-10-14 ENCOUNTER — Emergency Department (HOSPITAL_BASED_OUTPATIENT_CLINIC_OR_DEPARTMENT_OTHER)
Admission: EM | Admit: 2023-10-14 | Discharge: 2023-10-14 | Disposition: A | Discharge status: Home / Self Care | Attending: Emergency Medicine | Admitting: Emergency Medicine

## 2023-10-14 ENCOUNTER — Emergency Department (HOSPITAL_BASED_OUTPATIENT_CLINIC_OR_DEPARTMENT_OTHER)

## 2023-10-14 ENCOUNTER — Encounter (HOSPITAL_BASED_OUTPATIENT_CLINIC_OR_DEPARTMENT_OTHER): Payer: Self-pay

## 2023-10-14 ENCOUNTER — Other Ambulatory Visit: Payer: Self-pay

## 2023-10-14 DIAGNOSIS — K529 Noninfective gastroenteritis and colitis, unspecified: Secondary | ICD-10-CM | POA: Diagnosis not present

## 2023-10-14 DIAGNOSIS — I10 Essential (primary) hypertension: Secondary | ICD-10-CM | POA: Insufficient documentation

## 2023-10-14 DIAGNOSIS — Z79899 Other long term (current) drug therapy: Secondary | ICD-10-CM | POA: Diagnosis not present

## 2023-10-14 DIAGNOSIS — E876 Hypokalemia: Secondary | ICD-10-CM | POA: Diagnosis not present

## 2023-10-14 DIAGNOSIS — K625 Hemorrhage of anus and rectum: Secondary | ICD-10-CM | POA: Diagnosis present

## 2023-10-14 DIAGNOSIS — Q600 Renal agenesis, unilateral: Secondary | ICD-10-CM | POA: Diagnosis not present

## 2023-10-14 DIAGNOSIS — Z85828 Personal history of other malignant neoplasm of skin: Secondary | ICD-10-CM | POA: Diagnosis not present

## 2023-10-14 DIAGNOSIS — K5730 Diverticulosis of large intestine without perforation or abscess without bleeding: Secondary | ICD-10-CM | POA: Diagnosis not present

## 2023-10-14 LAB — COMPREHENSIVE METABOLIC PANEL WITH GFR
ALT: 20 U/L (ref 0–44)
AST: 21 U/L (ref 15–41)
Albumin: 4.4 g/dL (ref 3.5–5.0)
Alkaline Phosphatase: 41 U/L (ref 38–126)
Anion gap: 15 (ref 5–15)
BUN: 16 mg/dL (ref 8–23)
CO2: 25 mmol/L (ref 22–32)
Calcium: 9.6 mg/dL (ref 8.9–10.3)
Chloride: 94 mmol/L — ABNORMAL LOW (ref 98–111)
Creatinine, Ser: 0.83 mg/dL (ref 0.44–1.00)
GFR, Estimated: >60 mL/min
Glucose, Bld: 119 mg/dL — ABNORMAL HIGH (ref 70–99)
Potassium: 2.8 mmol/L — ABNORMAL LOW (ref 3.5–5.1)
Sodium: 133 mmol/L — ABNORMAL LOW (ref 135–145)
Total Bilirubin: 0.7 mg/dL (ref 0.0–1.2)
Total Protein: 7.2 g/dL (ref 6.5–8.1)

## 2023-10-14 LAB — URINALYSIS, ROUTINE W REFLEX MICROSCOPIC
Bilirubin Urine: NEGATIVE
Glucose, UA: NEGATIVE mg/dL
Ketones, ur: NEGATIVE mg/dL
Leukocytes,Ua: NEGATIVE
Nitrite: NEGATIVE
Protein, ur: NEGATIVE mg/dL
Specific Gravity, Urine: 1.02 (ref 1.005–1.030)
pH: 6 (ref 5.0–8.0)

## 2023-10-14 LAB — LIPASE, BLOOD: Lipase: 19 U/L (ref 11–51)

## 2023-10-14 LAB — CBC WITH DIFFERENTIAL/PLATELET
Abs Immature Granulocytes: 0.04 10*3/uL (ref 0.00–0.07)
Basophils Absolute: 0 10*3/uL (ref 0.0–0.1)
Basophils Relative: 0 %
Eosinophils Absolute: 0 10*3/uL (ref 0.0–0.5)
Eosinophils Relative: 0 %
HCT: 34.2 % — ABNORMAL LOW (ref 36.0–46.0)
Hemoglobin: 11.9 g/dL — ABNORMAL LOW (ref 12.0–15.0)
Immature Granulocytes: 0 %
Lymphocytes Relative: 9 %
Lymphs Abs: 1 10*3/uL (ref 0.7–4.0)
MCH: 30.6 pg (ref 26.0–34.0)
MCHC: 34.8 g/dL (ref 30.0–36.0)
MCV: 87.9 fL (ref 80.0–100.0)
Monocytes Absolute: 1.1 10*3/uL — ABNORMAL HIGH (ref 0.1–1.0)
Monocytes Relative: 9 %
Neutro Abs: 9.6 10*3/uL — ABNORMAL HIGH (ref 1.7–7.7)
Neutrophils Relative %: 82 %
Platelets: 214 10*3/uL (ref 150–400)
RBC: 3.89 MIL/uL (ref 3.87–5.11)
RDW: 12.3 % (ref 11.5–15.5)
WBC: 11.9 10*3/uL — ABNORMAL HIGH (ref 4.0–10.5)
nRBC: 0 % (ref 0.0–0.2)

## 2023-10-14 LAB — OCCULT BLOOD X 1 CARD TO LAB, STOOL: Fecal Occult Bld: POSITIVE — AB

## 2023-10-14 LAB — PROTIME-INR
INR: 1 (ref 0.8–1.2)
Prothrombin Time: 13 s (ref 11.4–15.2)

## 2023-10-14 MED ORDER — AMOXICILLIN-POT CLAVULANATE 875-125 MG PO TABS: 1.0000 | ORAL_TABLET | Freq: Two times a day (BID) | ORAL | 0 refills | Status: AC

## 2023-10-14 MED ORDER — IOHEXOL 350 MG/ML SOLN
100.0000 mL | Freq: Once | INTRAVENOUS | Status: AC | PRN
  Administered 2023-10-14: 100 mL via INTRAVENOUS

## 2023-10-14 MED ORDER — ONDANSETRON 4 MG PO TBDP: 4.0000 mg | ORAL_TABLET | Freq: Three times a day (TID) | ORAL | 0 refills | Status: AC | PRN

## 2023-10-14 MED ORDER — POTASSIUM CHLORIDE CRYS ER 20 MEQ PO TBCR
40.0000 meq | EXTENDED_RELEASE_TABLET | Freq: Once | ORAL | Status: AC
  Administered 2023-10-14: 40 meq via ORAL
  Filled 2023-10-14: qty 2

## 2023-10-14 MED ORDER — AMOXICILLIN-POT CLAVULANATE 875-125 MG PO TABS
1.0000 | ORAL_TABLET | Freq: Once | ORAL | Status: AC
  Administered 2023-10-14: 1 via ORAL
  Filled 2023-10-14: qty 1

## 2023-10-14 MED ORDER — SODIUM CHLORIDE 0.9 % IV BOLUS
500.0000 mL | Freq: Once | INTRAVENOUS | Status: AC
  Administered 2023-10-14: 500 mL via INTRAVENOUS

## 2023-10-14 NOTE — ED Triage Notes (Signed)
 In for eval of rectal bleeding onset this am at approx 0730. Bright red blood in toilet and on the tissue paper. Thought she needed to have a BM, had some darker clots in the stool. Reports abd pain with nausea and diarrhea on Monday around 0300 but has resolved.

## 2023-10-14 NOTE — Discharge Instructions (Addendum)
 Foods rich in both potassium and magnesium include: Fruits: Bananas, Avocados, Tomatoes, Dried apricots, and Cantaloupe.  Vegetables: Spinach, Potatoes, Sweet potatoes, Beans (kidney, lima, black), and Broccoli.  Nuts and Seeds: almonds, cashews, and pumpkin seeds.  Dairy: milk and yogurt.  Other:  Whole grains (brown rice, quinoa) Dark chocolate Tofu

## 2023-10-14 NOTE — ED Provider Notes (Signed)
 [facility] AT [facility] Provider Note   CSN: [id number] Arrival date & time: 10/14/23  0941     History  Chief Complaint  Patient presents with   Rectal Bleeding    Lauren Dunn is a 73 y.o. female.  Pt is a 73 yo female with pmhx significant for hld, htn, osteoporosis, and skin cancer.  Pt woke up this am and noticed some bright red blood on the toilet paper.  She felt like she had to have a bm and passed clots.  She had some nausea and diarrhea yesterday, but that resolved.  No fevers.  She said her lower abdomen feels heavy, but does not hurt.  She had a colonoscopy about 3 years ago and had some polyps removed that were benign.  No blood thinners.       Home Medications Prior to Admission medications   Medication Sig Start Date End Date Taking? Authorizing Provider  amoxicillin-clavulanate (AUGMENTIN) 875-125 MG tablet Take 1 tablet by mouth every 12 (twelve) hours. 10/14/23  Yes Sueellen Emery, MD  ondansetron (ZOFRAN-ODT) 4 MG disintegrating tablet Take 1 tablet (4 mg total) by mouth every 8 (eight) hours as needed for nausea or vomiting. 10/14/23  Yes Kanton Kamel, MD  Ascorbic Acid (VITAMIN C PO) Take by mouth.    [provider]  BIOTIN PO Take by mouth.    [provider]  CALCIUM  PO Take by mouth.    [provider]  cetirizine (ZYRTEC) 10 MG tablet Take 10 mg by mouth daily.    [provider]  Cholecalciferol (VITAMIN D3 PO) Take by mouth.    [provider]  Clobetasol  Prop Emollient Base (CLOBETASOL  PROPIONATE E) 0.05 % emollient cream Apply to affected area bid 07/30/21   Delfina Feller, FNP  denosumab  (PROLIA ) 60 MG/ML SOSY injection Inject 60 mg into the skin every 6 (six) months. 07/30/21   Gaylyn Keas Mary-Margaret, FNP  lisinopril -hydrochlorothiazide  (ZESTORETIC ) 20-12.5 MG tablet Take 2 tablets by mouth daily. 02/10/23   Gaylyn Keas Mary-Margaret, FNP  Multiple Vitamin (MULTIVITAMIN)  capsule Take 1 capsule by mouth daily.    [provider]  rosuvastatin  (CRESTOR ) 10 MG tablet Take 1 tablet (10 mg total) by mouth daily. 02/10/23   Gaylyn Keas Mary-Margaret, FNP  sertraline  (ZOLOFT ) 100 MG tablet Take 1 tablet (100 mg total) by mouth daily. 08/11/23   Gaylyn Keas Mary-Margaret, FNP  zolpidem  (AMBIEN ) 10 MG tablet Take 1 tablet (10 mg total) by mouth at bedtime as needed for sleep. 08/11/23 09/10/23  Delfina Feller, FNP      Allergies    Lipitor [atorvastatin] and Elemental sulfur    Review of Systems   Review of Systems  All other systems reviewed and are negative.   Physical Exam Updated Vital Signs BP (!) 150/97   Pulse 76   Temp 98.3 F (36.8 C)   Resp 19   Ht 5\' 3"  (1.6 m)   Wt 76.2 kg   SpO2 100%   BMI 29.76 kg/m  Physical Exam Vitals and nursing note reviewed. Exam conducted with a chaperone present.  Constitutional:      Appearance: Normal appearance.  HENT:     Head: Normocephalic and atraumatic.     Right Ear: External ear normal.     Left Ear: External ear normal.     Nose: Nose normal.     Mouth/Throat:     Mouth: Mucous membranes are dry.  Eyes:     Extraocular Movements: Extraocular movements intact.  Conjunctiva/sclera: Conjunctivae normal.     Pupils: Pupils are equal, round, and reactive to light.  Cardiovascular:     Rate and Rhythm: Normal rate and regular rhythm.     Pulses: Normal pulses.     Heart sounds: Normal heart sounds.  Pulmonary:     Effort: Pulmonary effort is normal.     Breath sounds: Normal breath sounds.  Abdominal:     General: Abdomen is flat. Bowel sounds are normal.     Palpations: Abdomen is soft.  Genitourinary:    Rectum: Guaiac result positive.     Comments: brbpr Musculoskeletal:        General: Normal range of motion.     Cervical back: Normal range of motion and neck supple.  Skin:    General: Skin is warm.     Capillary Refill: Capillary refill takes less than 2 seconds.  Neurological:      General: No focal deficit present.     Mental Status: She is alert and oriented to person, place, and time.  Psychiatric:        Mood and Affect: Mood normal.        Behavior: Behavior normal.     ED Results / Procedures / Treatments   Labs (all labs ordered are listed, but only abnormal results are displayed) Labs Reviewed  CBC WITH DIFFERENTIAL/PLATELET - Abnormal; Notable for the following components:      Result Value   WBC 11.9 (*)    Hemoglobin 11.9 (*)    HCT 34.2 (*)    Neutro Abs 9.6 (*)    Monocytes Absolute 1.1 (*)    All other components within normal limits  COMPREHENSIVE METABOLIC PANEL WITH GFR - Abnormal; Notable for the following components:   Sodium 133 (*)    Potassium 2.8 (*)    Chloride 94 (*)    Glucose, Bld 119 (*)    All other components within normal limits  URINALYSIS, ROUTINE W REFLEX MICROSCOPIC - Abnormal; Notable for the following components:   Color, Urine COLORLESS (*)    Hgb urine dipstick TRACE (*)    Bacteria, UA RARE (*)    All other components within normal limits  OCCULT BLOOD X 1 CARD TO LAB, STOOL - Abnormal; Notable for the following components:   Fecal Occult Bld POSITIVE (*)    All other components within normal limits  LIPASE, BLOOD  PROTIME-INR    EKG EKG Interpretation Date/Time:  Tuesday Oct 14 2023 10:33:29 EDT Ventricular Rate:  68 PR Interval:  157 QRS Duration:  100 QT Interval:  411 QTC Calculation: 438 R Axis:   4  Text Interpretation: Sinus rhythm No old tracing to compare Confirmed by Sueellen Emery [id number]) on 10/14/2023 10:35:03 AM  Radiology CT Angio Abd/Pel W and/or Wo Contrast Result Date: 10/14/2023 CLINICAL DATA:  Lower GI bleed, bright red blood per rectum * Tracking Code: BO * EXAM: CTA ABDOMEN AND PELVIS WITHOUT AND WITH CONTRAST TECHNIQUE: Multidetector CT imaging of the abdomen and pelvis was performed using the standard protocol during bolus administration of intravenous contrast. Multiplanar  reconstructed images and MIPs were obtained and reviewed to evaluate the vascular anatomy. RADIATION DOSE REDUCTION: This exam was performed according to the departmental dose-optimization program which includes automated exposure control, adjustment of the mA and/or kV according to patient size and/or use of iterative reconstruction technique. CONTRAST:  100mL OMNIPAQUE IOHEXOL 350 MG/ML SOLN COMPARISON:  None Available. FINDINGS: VASCULAR Normal contour and caliber of the abdominal aorta. No  evidence of aneurysm, dissection, or other acute aortic pathology. Standard branching pattern of the abdominal aorta with solitary bilateral renal arteries. Mild mixed atherosclerosis. Review of the MIP images confirms the above findings. NON-VASCULAR Lower Chest: No acute findings.  Coronary artery calcifications Hepatobiliary: No solid liver abnormality is seen. No gallstones, gallbladder wall thickening, or biliary dilatation. Pancreas: Unremarkable. No pancreatic ductal dilatation or surrounding inflammatory changes. Spleen: Normal in size without significant abnormality. Adrenals/Urinary Tract: Adrenal glands are unremarkable. Kidneys are normal, without renal calculi, solid lesion, or hydronephrosis. Bladder is unremarkable. Stomach/Bowel: Stomach is within normal limits. Appendix appears normal. Relatively long segment wall thickening and mucosal hyperenhancement of the colon involving the splenic flexure through the mid descending colon (series 13, image 79). No intraluminal contrast extravasation or other findings to specifically localize nidus of GI bleeding. Occasional sigmoid diverticula Lymphatic: No enlarged abdominal or pelvic lymph nodes. Reproductive: No mass or other significant abnormality. Other: No abdominal wall hernia or abnormality. No ascites. Musculoskeletal: No acute osseous findings. IMPRESSION: 1. Relatively long segment wall thickening and mucosal hyperenhancement of the colon involving the  splenic flexure through the mid descending colon, consistent with nonspecific infectious, inflammatory, or ischemic colitis. Underlying mass not strictly excluded. Consider colonoscopy at the resolution of acute clinical presentation. 2. No intraluminal contrast extravasation or other findings to specifically localize nidus of GI bleeding. 3. Occasional sigmoid diverticula, which do not appear to be specific source of colitis distributed more proximally. 4. Normal contour and caliber of the abdominal aorta. No evidence of aneurysm, dissection, or other acute aortic pathology. Mild mixed atherosclerosis. 5. Coronary artery disease. Aortic Atherosclerosis (ICD10-I70.0). Electronically Signed   By: Fredricka Jenny M.D.   On: 10/14/2023 12:13    Procedures Procedures    Medications Ordered in ED Medications  potassium chloride SA (KLOR-CON M) CR tablet 40 mEq (has no administration in time range)  amoxicillin-clavulanate (AUGMENTIN) 875-125 MG per tablet 1 tablet (has no administration in time range)  sodium chloride  0.9 % bolus 500 mL (0 mLs Intravenous Stopped 10/14/23 1151)  iohexol (OMNIPAQUE) 350 MG/ML injection 100 mL (100 mLs Intravenous Contrast Given 10/14/23 1132)    ED Course/ Medical Decision Making/ A&P                                 Medical Decision Making Amount and/or Complexity of Data Reviewed Labs: ordered. Radiology: ordered.  Risk Prescription drug management.   This patient presents to the ED for concern of rectal bleeding, this involves an extensive number of treatment options, and is a complaint that carries with it a high risk of complications and morbidity.  The differential diagnosis includes diverticulosis, colitis, hemorrhoids   Co morbidities that complicate the patient evaluation   hld, htn, osteoporosis, and skin cancer   Additional history obtained:  Additional history obtained from epic chart review  Lab Tests:  I Ordered, and personally  interpreted labs.  The pertinent results include:  cbc with hgb low at 11.9 (12.6 on 3/17); cmp with k low at 2.8, inr 1.0; ua with tr hgb; lip nl   Imaging Studies ordered:  I ordered imaging studies including cta  I independently visualized and interpreted imaging which showed   Relatively long segment wall thickening and mucosal  hyperenhancement of the colon involving the splenic flexure through  the mid descending colon, consistent with nonspecific infectious,  inflammatory, or ischemic colitis. Underlying mass not strictly  excluded. Consider  colonoscopy at the resolution of acute clinical  presentation.  2. No intraluminal contrast extravasation or other findings to  specifically localize nidus of GI bleeding.  3. Occasional sigmoid diverticula, which do not appear to be  specific source of colitis distributed more proximally.  4. Normal contour and caliber of the abdominal aorta. No evidence of  aneurysm, dissection, or other acute aortic pathology. Mild mixed  atherosclerosis.  5. Coronary artery disease.    Aortic Atherosclerosis (ICD10-I70.0).   I agree with the radiologist interpretation   Cardiac Monitoring:  The patient was maintained on a cardiac monitor.  I personally viewed and interpreted the cardiac monitored which showed an underlying rhythm of: nsr   Medicines ordered and prescription drug management:  I ordered medication including ivfs/augmentin/kdur  for sx  Reevaluation of the patient after these medicines showed that the patient improved I have reviewed the patients home medicines and have made adjustments as needed   Test Considered:  ct   Critical Interventions:  Abx/fluids   Problem List / ED Course:  Colitis:  pt does have colitis on CT scan.  I did think the safest option would be to admit for observation, but she wants to go home.  I told her she can always come back if she worsens.  She had a colonoscopy in Jan of 2023 that showed a  non pre-cancerous polyp, but did have a hx pre-cancerous polyps in the past.  She will need to f/u with [facility] as an outpatient.  Referral sent.  She knows to call if she does not hear from them. Hypokalemia:  likely due to diarrhea.  Pt given kdur here and is given a list of foods to eat which are high in k and in mg.   Reevaluation:  After the interventions noted above, I reevaluated the patient and found that they have :improved   Social Determinants of Health:  Lives at home   Dispostion:  After consideration of the diagnostic results and the patients response to treatment, I feel that the patent would benefit from discharge with outpatient f/u.          Final Clinical Impression(s) / ED Diagnoses Final diagnoses:  Colitis  Hypokalemia    Rx / DC Orders ED Discharge Orders          Ordered    amoxicillin-clavulanate (AUGMENTIN) 875-125 MG tablet  Every 12 hours        10/14/23 1231    ondansetron (ZOFRAN-ODT) 4 MG disintegrating tablet  Every 8 hours PRN        10/14/23 1231    Ambulatory referral to Gastroenterology       Comments: Colitis on CT scan.  Hx pre-cancerous polyps.   10/14/23 1239              Sueellen Emery, MD 10/14/23 1240

## 2023-10-27 ENCOUNTER — Encounter: Payer: Self-pay | Admitting: Dermatology

## 2023-10-27 ENCOUNTER — Ambulatory Visit: Admitting: Dermatology

## 2023-10-27 VITALS — BP 131/79 | HR 92

## 2023-10-27 DIAGNOSIS — L821 Other seborrheic keratosis: Secondary | ICD-10-CM

## 2023-10-27 DIAGNOSIS — W908XXA Exposure to other nonionizing radiation, initial encounter: Secondary | ICD-10-CM

## 2023-10-27 DIAGNOSIS — Z85828 Personal history of other malignant neoplasm of skin: Secondary | ICD-10-CM

## 2023-10-27 DIAGNOSIS — L814 Other melanin hyperpigmentation: Secondary | ICD-10-CM | POA: Diagnosis not present

## 2023-10-27 DIAGNOSIS — Z1283 Encounter for screening for malignant neoplasm of skin: Secondary | ICD-10-CM

## 2023-10-27 DIAGNOSIS — L578 Other skin changes due to chronic exposure to nonionizing radiation: Secondary | ICD-10-CM | POA: Diagnosis not present

## 2023-10-27 DIAGNOSIS — D229 Melanocytic nevi, unspecified: Secondary | ICD-10-CM

## 2023-10-27 DIAGNOSIS — D1801 Hemangioma of skin and subcutaneous tissue: Secondary | ICD-10-CM

## 2023-10-27 NOTE — Progress Notes (Signed)
   Follow-Up Visit   Subjective  Lauren Dunn is a 73 y.o. female who presents for the following: Skin Cancer Screening and Full Body Skin Exam  The patient presents for Total-Body Skin Exam (TBSE) for skin cancer screening and mole check. The patient has spots, moles and lesions to be evaluated, some may be new or changing. Pt has hx of BCC, R medial lower eyelid- txed by Dr Gideon Kussmaul  The following portions of the chart were reviewed this encounter and updated as appropriate: medications, allergies, medical history  Review of Systems:  No other skin or systemic complaints except as noted in HPI or Assessment and Plan.  Objective  Well appearing patient in no apparent distress; mood and affect are within normal limits.  A full examination was performed including scalp, head, eyes, ears, nose, lips, neck, chest, axillae, abdomen, back, buttocks, bilateral upper extremities, bilateral lower extremities, hands, feet, fingers, toes, fingernails, and toenails. All findings within normal limits unless otherwise noted below.   Relevant physical exam findings are noted in the Assessment and Plan.    Assessment & Plan   SKIN CANCER SCREENING PERFORMED TODAY.  ACTINIC DAMAGE - Chronic condition, secondary to cumulative UV/sun exposure - diffuse scaly erythematous macules with underlying dyspigmentation - Recommend daily broad spectrum sunscreen SPF 30+ to sun-exposed areas, reapply every 2 hours as needed.  - Staying in the shade or wearing long sleeves, sun glasses (UVA+UVB protection) and wide brim hats (4-inch brim around the entire circumference of the hat) are also recommended for sun protection.  - Call for new or changing lesions.   MELANOCYTIC NEVI - Tan-brown and/or pink-flesh-colored symmetric macules and papules - Benign appearing on exam today - Observation - Call clinic for new or changing moles - Recommend daily use of broad spectrum spf 30+ sunscreen to sun-exposed areas.    LENTIGINES Exam: scattered tan macules Due to sun exposure Treatment Plan: Benign-appearing, observe. Recommend daily broad spectrum sunscreen SPF 30+ to sun-exposed areas, reapply every 2 hours as needed.  Call for any changes   HEMANGIOMA Exam: red papule(s) Discussed benign nature. Recommend observation. Call for changes.   SEBORRHEIC KERATOSIS - Stuck-on, waxy, tan-brown papules and/or plaques  - Benign-appearing - Discussed benign etiology and prognosis. - Observe - Call for any changes  HISTORY OF BASAL CELL CARCINOMA OF THE SKIN - No evidence of recurrence today - Recommend regular full body skin exams - Recommend daily broad spectrum sunscreen SPF 30+ to sun-exposed areas, reapply every 2 hours as needed.  - Call if any new or changing lesions are noted between office visits     Return in about 6 months (around 04/27/2024) for 6-9 months for TBSE.  I, Wilson Hasten, CMA, am acting as scribe for Deneise Finlay, MD.   Documentation: I have reviewed the above documentation for accuracy and completeness, and I agree with the above.  Deneise Finlay, MD

## 2023-10-27 NOTE — Patient Instructions (Addendum)
 Skin Education : We counseled the patient regarding the following: Sun screen (SPF 30 or greater) should be applied during peak UV exposure (between 10am and 2pm) and reapplied after exercise or swimming.  The ABCDEs of melanoma were reviewed with the patient, and the importance of monthly self-examination of moles was emphasized. Should any moles change in shape or color, or itch, bleed or burn, pt will contact our office for evaluation sooner then their interval appointment.  Plan: Sunscreen Recommendations We recommended a broad spectrum sunscreen with a SPF of 30 or higher. I SPF 30 sunscreens block approximately 97 percent of the sun's harmful rays. Sunscreens should be applied at least 15 minutes prior to expected sun exposure and then every 2 hours after that as long as sun exposure continues. If swimming or exercising sunscreen should be reapplied every 45 minutes to an hour after getting wet or sweating. One ounce, or the equivalent of a shot glass full of sunscreen, is adequate to protect the skin not covered by a bathing suit. We also recommended a lip balm with a sunscreen as well. Sun protective clothing can be used in lieu of sunscreen but must be worn the entire time you are exposed to the sun's rays.   Important Information   Due to recent changes in healthcare laws, you may see results of your pathology and/or laboratory studies on MyChart before the doctors have had a chance to review them. We understand that in some cases there may be results that are confusing or concerning to you. Please understand that not all results are received at the same time and often the doctors may need to interpret multiple results in order to provide you with the best plan of care or course of treatment. Therefore, we ask that you please give us  2 business days to thoroughly review all your results before contacting the office for clarification. Should we see a critical lab result, you will be contacted  sooner.     If You Need Anything After Your Visit   If you have any questions or concerns for your doctor, please call our main line at [phone]. If no one answers, please leave a voicemail as directed and we will return your call as soon as possible. Messages left after 4 pm will be answered the following business day.    You may also send us  a message via MyChart. We typically respond to MyChart messages within 1-2 business days.  For prescription refills, please ask your pharmacy to contact our office. Our fax number is [phone].  If you have an urgent issue when the clinic is closed that cannot wait until the next business day, you can page your doctor at the number below.     Please note that while we do our best to be available for urgent issues outside of office hours, we are not available 24/7.    If you have an urgent issue and are unable to reach us , you may choose to seek medical care at your doctor's office, retail clinic, urgent care center, or emergency room.   If you have a medical emergency, please immediately call 911 or go to the emergency department. In the event of inclement weather, please call our main line at [phone] for an update on the status of any delays or closures.  Dermatology Medication Tips: Please keep the boxes that topical medications come in in order to help keep track of the instructions about where and how to use these. Pharmacies  typically print the medication instructions only on the boxes and not directly on the medication tubes.   If your medication is too expensive, please contact our office at [phone] or send us  a message through MyChart.    We are unable to tell what your co-pay for medications will be in advance as this is different depending on your insurance coverage. However, we may be able to find a substitute medication at lower cost or fill out paperwork to get insurance to cover a needed medication.    If a prior  authorization is required to get your medication covered by your insurance company, please allow us  1-2 business days to complete this process.   Drug prices often vary depending on where the prescription is filled and some pharmacies may offer cheaper prices.   The website www.goodrx.com contains coupons for medications through different pharmacies. The prices here do not account for what the cost may be with help from insurance (it may be cheaper with your insurance), but the website can give you the price if you did not use any insurance.  - You can print the associated coupon and take it with your prescription to the pharmacy.  - You may also stop by our office during regular business hours and pick up a GoodRx coupon card.  - If you need your prescription sent electronically to a different pharmacy, notify our office through [facility] or by phone at [phone]

## 2023-10-29 ENCOUNTER — Other Ambulatory Visit: Payer: Self-pay | Admitting: Nurse Practitioner

## 2023-10-29 DIAGNOSIS — I10 Essential (primary) hypertension: Secondary | ICD-10-CM

## 2023-10-29 DIAGNOSIS — E785 Hyperlipidemia, unspecified: Secondary | ICD-10-CM

## 2023-11-12 ENCOUNTER — Encounter: Payer: Self-pay | Admitting: *Deleted

## 2023-11-26 ENCOUNTER — Encounter: Payer: Self-pay | Admitting: *Deleted

## 2023-11-26 NOTE — Progress Notes (Signed)
 Attempted to reach patient regarding follow-up LDCT. Unable to reach patient directly. Detailed VM left asking that the patient return my call.

## 2023-12-26 NOTE — Progress Notes (Signed)
 Attempted to reach patient regarding follow-up LDCT. Unable to reach patient directly. Detailed VM left asking that the patient return my call. Due to multiple unsuccessful attempts to reach the patient, LDCT referral will be closed at this time. PCP made aware.

## 2024-01-08 ENCOUNTER — Telehealth: Payer: Self-pay

## 2024-01-08 DIAGNOSIS — M810 Age-related osteoporosis without current pathological fracture: Secondary | ICD-10-CM

## 2024-01-08 MED ORDER — DENOSUMAB 60 MG/ML ~~LOC~~ SOSY: 60.0000 mg | PREFILLED_SYRINGE | Freq: Once | SUBCUTANEOUS | Status: DC

## 2024-01-08 NOTE — Telephone Encounter (Signed)
 Prolia  VOB initiated via [email or web address]  Next Prolia  inj DUE: 02/11/24

## 2024-01-08 NOTE — Telephone Encounter (Signed)
 Polia sent for benefit verification

## 2024-01-12 ENCOUNTER — Other Ambulatory Visit (HOSPITAL_COMMUNITY): Payer: Self-pay

## 2024-01-12 NOTE — Telephone Encounter (Signed)
 SABRA

## 2024-01-12 NOTE — Telephone Encounter (Signed)
 Pt ready for scheduling for PROLIA  on or after : 02/11/24  Option# 1: Buy/Bill (Office supplied medication)  Out-of-pocket cost due at time of clinic visit: $357  Number of injection/visits approved: 2  Primary: [employer] Prolia  co-insurance: 20% Admin fee co-insurance: 20%  Secondary: --- Prolia  co-insurance:  Admin fee co-insurance:   Medical Benefit Details: Date Benefits were checked: 01/09/24 Deductible: NO/ Coinsurance: 20%/ Admin Fee: 20%  Prior Auth: APPROVED PA# [id number]  Expiration Date: 05/28/23-05/26/24   # of doses approved: 2 ----------------------------------------------------------------------- Option# 2- Med Obtained from pharmacy:  Pharmacy benefit: Copay $64 (Paid to pharmacy) Admin Fee: 20% (Pay at clinic)  Prior Auth: N/A PA# Expiration Date:   # of doses approved:   If patient wants fill through the pharmacy benefit please send prescription to: [facility], and include estimated need by date in rx notes. Pharmacy will ship medication directly to the office.  Patient NOT eligible for Prolia  Copay Card. Copay Card can make patient's cost as little as $25. Link to apply: [email or web address]  ** This summary of benefits is an estimation of the patient's out-of-pocket cost. Exact cost may very based on individual plan coverage.

## 2024-01-13 NOTE — Telephone Encounter (Signed)
 Called patient LMTCB.

## 2024-02-09 ENCOUNTER — Encounter: Payer: Self-pay | Admitting: Nurse Practitioner

## 2024-02-09 ENCOUNTER — Ambulatory Visit: Admitting: Nurse Practitioner

## 2024-02-09 VITALS — BP 136/82 | HR 70 | Temp 97.6°F | Ht 63.0 in | Wt 163.8 lb

## 2024-02-09 DIAGNOSIS — I10 Essential (primary) hypertension: Secondary | ICD-10-CM

## 2024-02-09 DIAGNOSIS — F17200 Nicotine dependence, unspecified, uncomplicated: Secondary | ICD-10-CM

## 2024-02-09 DIAGNOSIS — E785 Hyperlipidemia, unspecified: Secondary | ICD-10-CM

## 2024-02-09 DIAGNOSIS — Z1211 Encounter for screening for malignant neoplasm of colon: Secondary | ICD-10-CM | POA: Diagnosis not present

## 2024-02-09 DIAGNOSIS — F5101 Primary insomnia: Secondary | ICD-10-CM

## 2024-02-09 DIAGNOSIS — M810 Age-related osteoporosis without current pathological fracture: Secondary | ICD-10-CM

## 2024-02-09 DIAGNOSIS — Z6828 Body mass index (BMI) 28.0-28.9, adult: Secondary | ICD-10-CM

## 2024-02-09 DIAGNOSIS — F339 Major depressive disorder, recurrent, unspecified: Secondary | ICD-10-CM | POA: Diagnosis not present

## 2024-02-09 MED ORDER — ROSUVASTATIN CALCIUM 10 MG PO TABS: 10.0000 mg | ORAL_TABLET | Freq: Every day | ORAL | 1 refills | Status: AC

## 2024-02-09 MED ORDER — LISINOPRIL-HYDROCHLOROTHIAZIDE 20-12.5 MG PO TABS: 2.0000 | ORAL_TABLET | Freq: Every day | ORAL | 1 refills | Status: AC

## 2024-02-09 MED ORDER — SERTRALINE HCL 100 MG PO TABS: 100.0000 mg | ORAL_TABLET | Freq: Every day | ORAL | 1 refills | Status: AC

## 2024-02-09 NOTE — Progress Notes (Signed)
 Subjective:    Patient ID: Lauren Dunn, female    DOB: 09/08/1950, 73 y.o.   MRN: [id number]   Chief Complaint: medical management of chronic issues     HPI:  Lauren Dunn is a 73 y.o. who identifies as a female who was assigned female at birth.   Social history: Lives with: by herself Work history: retired from [employer]   Comes in today for follow up of the following chronic medical issues:  1. Primary hypertension No c/o chest pain, sob or headache. Does not check blood pressure at home. BP Readings from Last 3 Encounters:  10/27/23 131/79  10/14/23 (!) 144/87  08/19/23 126/79     2. Hyperlipidemia with target LDL less than 100 Does try to watch diet but does no dedicated exercise. Lab Results  Component Value Date   CHOL 161 08/11/2023   HDL 46 08/11/2023   LDLCALC 80 08/11/2023   TRIG 207 (H) 08/11/2023   CHOLHDL 3.5 08/11/2023   The 10-year ASCVD risk score (Arnett DK, et al., 2019) is: 20.8%    3. Age-related osteoporosis without current pathological fracture No weight bearing exercises. Last dexascan was done on 07/30/21.  T score was -1.2. She is on prolia  injections.  4. Smoker She has stopped smoking and is now vaping.  5. BMI 25.0-28.9 Wt Readings from Last 3 Encounters:  02/09/24 163 lb 12.8 oz (74.3 kg)  10/14/23 168 lb (76.2 kg)  08/11/23 166 lb 3.2 oz (75.4 kg)   BMI Readings from Last 3 Encounters:  02/09/24 29.02 kg/m  10/14/23 29.76 kg/m  08/11/23 29.44 kg/m      New complaints: None today  Allergies  Allergen Reactions   Lipitor [Atorvastatin]     myalgia   Elemental Sulfur     Eye drop--- Inflammation to eyes   Outpatient Encounter Medications as of 02/09/2024  Medication Sig   amoxicillin -clavulanate (AUGMENTIN ) 875-125 MG tablet Take 1 tablet by mouth every 12 (twelve) hours.   Ascorbic Acid (VITAMIN C PO) Take by mouth.   BIOTIN PO Take by mouth.   CALCIUM  PO Take by mouth.   cetirizine (ZYRTEC) 10 MG tablet Take 10 mg  by mouth daily.   Cholecalciferol (VITAMIN D3 PO) Take by mouth.   Clobetasol  Prop Emollient Base (CLOBETASOL  PROPIONATE E) 0.05 % emollient cream Apply to affected area bid   lisinopril -hydrochlorothiazide  (ZESTORETIC ) 20-12.5 MG tablet Take 2 tablets by mouth once daily   Multiple Vitamin (MULTIVITAMIN) capsule Take 1 capsule by mouth daily.   ondansetron  (ZOFRAN -ODT) 4 MG disintegrating tablet Take 1 tablet (4 mg total) by mouth every 8 (eight) hours as needed for nausea or vomiting.   rosuvastatin  (CRESTOR ) 10 MG tablet Take 1 tablet by mouth once daily   sertraline  (ZOLOFT ) 100 MG tablet Take 1 tablet (100 mg total) by mouth daily.   zolpidem  (AMBIEN ) 10 MG tablet Take 1 tablet (10 mg total) by mouth at bedtime as needed for sleep.   Facility-Administered Encounter Medications as of 02/09/2024  Medication   denosumab  (PROLIA ) injection 60 mg    Past Surgical History:  Procedure Laterality Date   COLONOSCOPY  2019   2013, 2019   POLYPECTOMY     2019 last colon TA   WISDOM TOOTH EXTRACTION      Family History  Problem Relation Age of Onset   Stroke Mother    Hypertension Mother    Hypertension Sister    Hypertension Sister    Diabetes Sister  Diabetes Sister    Diabetes Brother    Diabetes Nephew    Colon cancer Neg Hx    Esophageal cancer Neg Hx    Stomach cancer Neg Hx    Rectal cancer Neg Hx    Colon polyps Neg Hx    Breast cancer Neg Hx       Controlled substance contract: n/a     Review of Systems  Constitutional:  Negative for diaphoresis.  Eyes:  Negative for pain.  Respiratory:  Negative for shortness of breath.   Cardiovascular:  Negative for chest pain, palpitations and leg swelling.  Gastrointestinal:  Negative for abdominal pain.  Endocrine: Negative for polydipsia.  Skin:  Negative for rash.  Neurological:  Negative for dizziness, weakness and headaches.  Hematological:  Does not bruise/bleed easily.  All other systems reviewed and are  negative.      Objective:   Physical Exam Vitals and nursing note reviewed.  Constitutional:      General: She is not in acute distress.    Appearance: Normal appearance. She is well-developed.  HENT:     Head: Normocephalic.     Right Ear: Tympanic membrane normal.     Left Ear: Tympanic membrane normal.     Nose: Nose normal.     Mouth/Throat:     Mouth: Mucous membranes are moist.  Eyes:     Pupils: Pupils are equal, round, and reactive to light.  Neck:     Vascular: No carotid bruit or JVD.  Cardiovascular:     Rate and Rhythm: Normal rate and regular rhythm.     Heart sounds: Normal heart sounds.  Pulmonary:     Effort: Pulmonary effort is normal. No respiratory distress.     Breath sounds: Normal breath sounds. No wheezing or rales.  Chest:     Chest wall: No tenderness.  Abdominal:     General: Bowel sounds are normal. There is no distension or abdominal bruit.     Palpations: Abdomen is soft. There is no hepatomegaly, splenomegaly, mass or pulsatile mass.     Tenderness: There is no abdominal tenderness.  Musculoskeletal:        General: Normal range of motion.     Cervical back: Normal range of motion and neck supple.  Lymphadenopathy:     Cervical: No cervical adenopathy.  Skin:    General: Skin is warm and dry.  Neurological:     Mental Status: She is alert and oriented to person, place, and time.     Deep Tendon Reflexes: Reflexes are normal and symmetric.  Psychiatric:        Behavior: Behavior normal.        Thought Content: Thought content normal.        Judgment: Judgment normal.     BP 136/82   Pulse 70   Temp 97.6 F (36.4 C) (Skin)   Ht 5' 3 (1.6 m)   Wt 163 lb 12.8 oz (74.3 kg)   BMI 29.02 kg/m          Assessment & Plan:   Lauren Dunn comes in today with chief complaint of medical management of chronic issues    Diagnosis and orders addressed:  1. Primary hypertension Low sodium diet - CBC with Differential/Platelet -  CMP14+EGFR - lisinopril -hydrochlorothiazide  (ZESTORETIC ) 20-12.5 MG tablet; Take 2 tablets by mouth daily.  Dispense: 180 tablet; Refill: 1  2. Hyperlipidemia with target LDL less than 100 Low fat diet - Lipid panel - rosuvastatin  (CRESTOR )  10 MG tablet; Take 1 tablet (10 mg total) by mouth daily.  Dispense: 90 tablet; Refill: 1  3. Age-related osteoporosis without current pathological fracture Weight bearing exercises  4. Smoker Vaping cessation   Labs pending Health Maintenance reviewed Diet and exercise encouraged  Follow up plan: 6 months   Mary-Margaret Gladis, FNP

## 2024-02-09 NOTE — Patient Instructions (Signed)

## 2024-02-10 ENCOUNTER — Ambulatory Visit: Payer: Self-pay | Admitting: Nurse Practitioner

## 2024-02-10 LAB — CBC WITH DIFFERENTIAL/PLATELET
Basophils Absolute: 0.1 x10E3/uL (ref 0.0–0.2)
Basos: 1 %
EOS (ABSOLUTE): 0.2 x10E3/uL (ref 0.0–0.4)
Eos: 2 %
Hematocrit: 37.2 % (ref 34.0–46.6)
Hemoglobin: 12.8 g/dL (ref 11.1–15.9)
Immature Grans (Abs): 0 x10E3/uL (ref 0.0–0.1)
Immature Granulocytes: 0 %
Lymphocytes Absolute: 1.6 x10E3/uL (ref 0.7–3.1)
Lymphs: 19 %
MCH: 30.8 pg (ref 26.6–33.0)
MCHC: 34.4 g/dL (ref 31.5–35.7)
MCV: 89 fL (ref 79–97)
Monocytes Absolute: 0.6 x10E3/uL (ref 0.1–0.9)
Monocytes: 8 %
Neutrophils Absolute: 5.8 x10E3/uL (ref 1.4–7.0)
Neutrophils: 70 %
Platelets: 259 x10E3/uL (ref 150–450)
RBC: 4.16 x10E6/uL (ref 3.77–5.28)
RDW: 13.2 % (ref 11.7–15.4)
WBC: 8.4 x10E3/uL (ref 3.4–10.8)

## 2024-02-10 LAB — CMP14+EGFR
ALT: 21 IU/L (ref 0–32)
AST: 26 IU/L (ref 0–40)
Albumin: 4.7 g/dL (ref 3.8–4.8)
Alkaline Phosphatase: 45 IU/L — ABNORMAL LOW (ref 49–135)
BUN/Creatinine Ratio: 27 (ref 12–28)
BUN: 22 mg/dL (ref 8–27)
Bilirubin Total: 0.3 mg/dL (ref 0.0–1.2)
CO2: 22 mmol/L (ref 20–29)
Calcium: 10.4 mg/dL — ABNORMAL HIGH (ref 8.7–10.3)
Chloride: 98 mmol/L (ref 96–106)
Creatinine, Ser: 0.83 mg/dL (ref 0.57–1.00)
Globulin, Total: 2.3 g/dL (ref 1.5–4.5)
Glucose: 79 mg/dL (ref 70–99)
Potassium: 4.1 mmol/L (ref 3.5–5.2)
Sodium: 137 mmol/L (ref 134–144)
Total Protein: 7 g/dL (ref 6.0–8.5)
eGFR: 74 mL/min/1.73 (ref >59)

## 2024-02-10 LAB — LIPID PANEL
Chol/HDL Ratio: 4.4 ratio (ref 0.0–4.4)
Cholesterol, Total: 175 mg/dL (ref 100–199)
HDL: 40 mg/dL (ref >39)
LDL Chol Calc (NIH): 84 mg/dL (ref 0–99)
Triglycerides: 314 mg/dL — ABNORMAL HIGH (ref 0–149)
VLDL Cholesterol Cal: 51 mg/dL — ABNORMAL HIGH (ref 5–40)

## 2024-02-12 ENCOUNTER — Ambulatory Visit: Admitting: Nurse Practitioner

## 2024-02-17 ENCOUNTER — Ambulatory Visit

## 2024-02-26 ENCOUNTER — Other Ambulatory Visit: Payer: Self-pay

## 2024-02-26 DIAGNOSIS — M810 Age-related osteoporosis without current pathological fracture: Secondary | ICD-10-CM

## 2024-03-03 ENCOUNTER — Other Ambulatory Visit (HOSPITAL_COMMUNITY): Payer: Self-pay

## 2024-03-03 ENCOUNTER — Other Ambulatory Visit: Payer: Self-pay

## 2024-03-03 MED ORDER — DENOSUMAB 60 MG/ML ~~LOC~~ SOSY
60.0000 mg | PREFILLED_SYRINGE | Freq: Once | SUBCUTANEOUS | 0 refills | Status: AC
  Filled 2024-03-03: qty 1, 1d supply, fill #0

## 2024-03-03 NOTE — Addendum Note (Signed)
 Addended by: BRANDY SETTER D on: 03/03/2024 04:02 PM   Modules accepted: Orders

## 2024-03-08 ENCOUNTER — Other Ambulatory Visit: Payer: Self-pay

## 2024-03-08 NOTE — Progress Notes (Signed)
 Pharmacy Patient Advocate Encounter  Insurance verification completed.   The patient is insured through [employer]   Ran test claim for Prolia. Co-pay is $64.  This test claim was processed through [facility]- copay amounts may vary at other pharmacies due to pharmacy/plan contracts, or as the patient moves through the different stages of their insurance plan.

## 2024-03-10 ENCOUNTER — Other Ambulatory Visit: Payer: Self-pay

## 2024-03-12 ENCOUNTER — Other Ambulatory Visit: Payer: Self-pay

## 2024-03-17 ENCOUNTER — Other Ambulatory Visit: Payer: Self-pay

## 2024-03-23 ENCOUNTER — Other Ambulatory Visit: Payer: Self-pay

## 2024-03-26 ENCOUNTER — Telehealth: Payer: Self-pay

## 2024-03-26 ENCOUNTER — Other Ambulatory Visit: Payer: Self-pay

## 2024-03-29 ENCOUNTER — Other Ambulatory Visit: Payer: Self-pay

## 2024-03-29 NOTE — Progress Notes (Signed)
 Patient has not returned calls or MyChart message. Dis-enrolling.

## 2024-04-05 ENCOUNTER — Other Ambulatory Visit: Payer: Self-pay

## 2024-04-05 NOTE — Telephone Encounter (Signed)
 Contacted patient to collect payment for Prolia  injection

## 2024-04-08 ENCOUNTER — Encounter (INDEPENDENT_AMBULATORY_CARE_PROVIDER_SITE_OTHER): Payer: Self-pay | Admitting: *Deleted

## 2024-05-26 ENCOUNTER — Telehealth: Payer: Self-pay

## 2024-05-26 NOTE — Telephone Encounter (Signed)
 FYI: multiple attempts (phone and mychart)  have been made my office and pharmacy to contact patient regarding Prolia  injection. Patient will no contact either office of pharmacy back - unable to fill medication or schedule appt

## 2024-06-08 ENCOUNTER — Telehealth: Payer: Self-pay

## 2024-06-08 ENCOUNTER — Other Ambulatory Visit (HOSPITAL_COMMUNITY): Payer: Self-pay

## 2024-06-08 DIAGNOSIS — M810 Age-related osteoporosis without current pathological fracture: Secondary | ICD-10-CM

## 2024-06-08 MED ORDER — DENOSUMAB 60 MG/ML ~~LOC~~ SOSY: 60.0000 mg | PREFILLED_SYRINGE | Freq: Once | SUBCUTANEOUS | Status: AC

## 2024-06-08 NOTE — Telephone Encounter (Signed)
 Prolia  sent for benefit verification Resent as previous VOB was before the new year - will attempt to contact patient to schedule once received

## 2024-06-08 NOTE — Telephone Encounter (Signed)
 Prolia VOB initiated via [email or web address]  Next Prolia inj DUE: NOW

## 2024-06-11 NOTE — Telephone Encounter (Signed)
 SABRA

## 2024-06-11 NOTE — Telephone Encounter (Addendum)
 MEDICAL PA SUBMITTED VIA LATENT. KEY: [id number]

## 2024-06-14 ENCOUNTER — Other Ambulatory Visit (HOSPITAL_COMMUNITY): Payer: Self-pay

## 2024-06-14 NOTE — Telephone Encounter (Addendum)
 Pt ready for scheduling for PROLIA  on or after : 06/14/24  Option# 1: Buy/Bill (Office supplied medication)  Out-of-pocket cost due at time of clinic visit: $392  Number of injection/visits approved: 2  Primary: [employer] Prolia  co-insurance: 20% Admin fee co-insurance: $40  Secondary: --- Prolia  co-insurance:  Admin fee co-insurance:   Medical Benefit Details: Date Benefits were checked: 06/09/24 Deductible: NO/ Coinsurance: 20%/ Admin Fee: $40  Prior Auth: APPROVED PA# [id number] Expiration Date: 01/02/22-05/26/25  # of doses approved: 2 ----------------------------------------------------------------------- Option# 2- Med Obtained from pharmacy:  Pharmacy benefit: Copay $64 (Paid to pharmacy) Admin Fee: $40 (Pay at clinic)  Prior Auth: N/A PA# Expiration Date:   # of doses approved:   If patient wants fill through the pharmacy benefit please send prescription to: [facility], and include estimated need by date in rx notes. Pharmacy will ship medication directly to the office. ----------------------------------------------------------------------- Option# 3- Med obtained from pharmacy and shipped to clinic: MEDICAL BENEFIT VIA SPECIALTY PHARMACY ([facility])  Pharmacy benefit: Copay $0 (Paid to pharmacy) Admin Fee: $40 (Pay at clinic)  Prior Auth: APPROVED PA# [id number] Expiration Date: 01/02/22-05/26/25 # of doses approved: 2 -----------------------------------------------------------------------  Patient NOT eligible for Prolia  Copay Card. Copay Card can make patient's cost as little as $25. Link to apply: [email or web address]://[email or web address]  ** This summary of benefits is an estimation of the patient's out-of-pocket cost. Exact cost may very based on individual plan coverage.

## 2024-06-16 NOTE — Telephone Encounter (Signed)
 Called today and left message to call back.  Multiple attempts have been made by the office and the pharmacy regarding Prolia  injection.

## 2024-08-05 ENCOUNTER — Ambulatory Visit: Payer: Self-pay | Admitting: Nurse Practitioner

## 2024-11-25 ENCOUNTER — Ambulatory Visit: Admitting: Dermatology
